# Patient Record
Sex: Female | Born: 1985 | Race: Black or African American | Hispanic: No | Marital: Single | Smoking: Never smoker
Health system: Southern US, Community
[De-identification: ages and names within clinical notes are randomized; demographics above are authoritative.]

## PROBLEM LIST (undated history)

## (undated) ENCOUNTER — Inpatient Hospital Stay (HOSPITAL_COMMUNITY): Payer: Self-pay

## (undated) DIAGNOSIS — O009 Unspecified ectopic pregnancy without intrauterine pregnancy: Secondary | ICD-10-CM

## (undated) DIAGNOSIS — A6000 Herpesviral infection of urogenital system, unspecified: Secondary | ICD-10-CM

## (undated) HISTORY — PX: NO PAST SURGERIES: SHX2092

---
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
## 2005-06-10 ENCOUNTER — Ambulatory Visit: Payer: Self-pay | Admitting: Obstetrics & Gynecology

## 2005-06-17 ENCOUNTER — Encounter (INDEPENDENT_AMBULATORY_CARE_PROVIDER_SITE_OTHER): Payer: Self-pay | Admitting: Specialist

## 2005-06-17 ENCOUNTER — Ambulatory Visit: Payer: Self-pay | Admitting: Obstetrics & Gynecology

## 2005-06-17 ENCOUNTER — Other Ambulatory Visit: Admission: RE | Admit: 2005-06-17 | Discharge: 2005-06-17 | Payer: Self-pay | Admitting: Obstetrics & Gynecology

## 2005-07-02 ENCOUNTER — Ambulatory Visit: Payer: Self-pay | Admitting: Obstetrics & Gynecology

## 2005-07-18 ENCOUNTER — Emergency Department (HOSPITAL_COMMUNITY): Admission: EM | Admit: 2005-07-18 | Discharge: 2005-07-18 | Payer: Self-pay | Admitting: Emergency Medicine

## 2005-07-29 ENCOUNTER — Emergency Department (HOSPITAL_COMMUNITY): Admission: EM | Admit: 2005-07-29 | Discharge: 2005-07-29 | Payer: Self-pay | Admitting: Emergency Medicine

## 2006-02-01 ENCOUNTER — Emergency Department (HOSPITAL_COMMUNITY): Admission: EM | Admit: 2006-02-01 | Discharge: 2006-02-01 | Payer: Self-pay

## 2006-04-24 ENCOUNTER — Emergency Department (HOSPITAL_COMMUNITY): Admission: EM | Admit: 2006-04-24 | Discharge: 2006-04-24 | Payer: Self-pay | Admitting: Emergency Medicine

## 2006-04-25 ENCOUNTER — Emergency Department (HOSPITAL_COMMUNITY): Admission: EM | Admit: 2006-04-25 | Discharge: 2006-04-25 | Payer: Self-pay | Admitting: Emergency Medicine

## 2006-06-13 ENCOUNTER — Emergency Department (HOSPITAL_COMMUNITY): Admission: EM | Admit: 2006-06-13 | Discharge: 2006-06-13 | Payer: Self-pay | Admitting: Emergency Medicine

## 2006-10-27 ENCOUNTER — Ambulatory Visit: Payer: Self-pay | Admitting: Obstetrics & Gynecology

## 2006-10-27 ENCOUNTER — Encounter: Payer: Self-pay | Admitting: Obstetrics & Gynecology

## 2006-10-27 ENCOUNTER — Other Ambulatory Visit: Admission: RE | Admit: 2006-10-27 | Discharge: 2006-10-27 | Payer: Self-pay | Admitting: Obstetrics & Gynecology

## 2007-03-25 ENCOUNTER — Emergency Department (HOSPITAL_COMMUNITY): Admission: EM | Admit: 2007-03-25 | Discharge: 2007-03-25 | Payer: Self-pay | Admitting: Emergency Medicine

## 2007-10-22 ENCOUNTER — Emergency Department (HOSPITAL_COMMUNITY): Admission: EM | Admit: 2007-10-22 | Discharge: 2007-10-22 | Payer: Self-pay | Admitting: Emergency Medicine

## 2007-11-17 ENCOUNTER — Emergency Department (HOSPITAL_COMMUNITY): Admission: EM | Admit: 2007-11-17 | Discharge: 2007-11-17 | Payer: Self-pay | Admitting: Emergency Medicine

## 2007-11-19 ENCOUNTER — Emergency Department (HOSPITAL_COMMUNITY): Admission: EM | Admit: 2007-11-19 | Discharge: 2007-11-19 | Payer: Self-pay | Admitting: Emergency Medicine

## 2008-02-02 ENCOUNTER — Emergency Department (HOSPITAL_COMMUNITY): Admission: EM | Admit: 2008-02-02 | Discharge: 2008-02-02 | Payer: Self-pay | Admitting: Emergency Medicine

## 2008-07-13 ENCOUNTER — Emergency Department (HOSPITAL_COMMUNITY): Admission: EM | Admit: 2008-07-13 | Discharge: 2008-07-14 | Payer: Self-pay | Admitting: Emergency Medicine

## 2009-01-31 ENCOUNTER — Emergency Department (HOSPITAL_COMMUNITY): Admission: EM | Admit: 2009-01-31 | Discharge: 2009-01-31 | Payer: Self-pay | Admitting: Emergency Medicine

## 2009-07-23 ENCOUNTER — Emergency Department (HOSPITAL_COMMUNITY): Admission: EM | Admit: 2009-07-23 | Discharge: 2009-07-23 | Payer: Self-pay | Admitting: Emergency Medicine

## 2010-02-11 ENCOUNTER — Emergency Department (HOSPITAL_COMMUNITY)
Admission: EM | Admit: 2010-02-11 | Discharge: 2010-02-11 | Disposition: A | Discharge status: Home / Self Care | Payer: Self-pay | Attending: Emergency Medicine | Admitting: Emergency Medicine

## 2010-02-11 DIAGNOSIS — B9689 Other specified bacterial agents as the cause of diseases classified elsewhere: Secondary | ICD-10-CM | POA: Insufficient documentation

## 2010-02-11 DIAGNOSIS — A499 Bacterial infection, unspecified: Secondary | ICD-10-CM | POA: Insufficient documentation

## 2010-02-11 DIAGNOSIS — N760 Acute vaginitis: Secondary | ICD-10-CM | POA: Insufficient documentation

## 2010-02-11 DIAGNOSIS — L989 Disorder of the skin and subcutaneous tissue, unspecified: Secondary | ICD-10-CM | POA: Insufficient documentation

## 2010-02-11 LAB — URINALYSIS, ROUTINE W REFLEX MICROSCOPIC
Bilirubin Urine: NEGATIVE
Hgb urine dipstick: NEGATIVE
Ketones, ur: NEGATIVE mg/dL
Nitrite: NEGATIVE
Protein, ur: NEGATIVE mg/dL
Specific Gravity, Urine: 1.025 (ref 1.005–1.030)
Urine Glucose, Fasting: NEGATIVE mg/dL
Urobilinogen, UA: 1 mg/dL (ref 0.0–1.0)
pH: 6.5 (ref 5.0–8.0)

## 2010-02-11 LAB — WET PREP, GENITAL
Trich, Wet Prep: NONE SEEN
Yeast Wet Prep HPF POC: NONE SEEN

## 2010-02-12 ENCOUNTER — Emergency Department (HOSPITAL_COMMUNITY)
Admission: EM | Admit: 2010-02-12 | Discharge: 2010-02-12 | Disposition: A | Discharge status: Home / Self Care | Payer: Self-pay | Attending: Emergency Medicine | Admitting: Emergency Medicine

## 2010-02-12 DIAGNOSIS — L989 Disorder of the skin and subcutaneous tissue, unspecified: Secondary | ICD-10-CM | POA: Insufficient documentation

## 2010-02-12 DIAGNOSIS — N898 Other specified noninflammatory disorders of vagina: Secondary | ICD-10-CM | POA: Insufficient documentation

## 2010-02-12 DIAGNOSIS — R21 Rash and other nonspecific skin eruption: Secondary | ICD-10-CM | POA: Insufficient documentation

## 2010-02-14 LAB — WOUND CULTURE: Gram Stain: NONE SEEN

## 2010-02-15 LAB — HERPES SIMPLEX VIRUS CULTURE: Culture: DETECTED

## 2010-03-18 ENCOUNTER — Inpatient Hospital Stay (HOSPITAL_COMMUNITY)
Admission: AD | Admit: 2010-03-18 | Discharge: 2010-03-18 | Disposition: A | Discharge status: Home / Self Care | Payer: Medicaid Other | Source: Ambulatory Visit | Attending: Obstetrics & Gynecology | Admitting: Obstetrics & Gynecology

## 2010-03-18 ENCOUNTER — Inpatient Hospital Stay (HOSPITAL_COMMUNITY): Payer: Medicaid Other

## 2010-03-18 DIAGNOSIS — R109 Unspecified abdominal pain: Secondary | ICD-10-CM

## 2010-03-18 DIAGNOSIS — R52 Pain, unspecified: Secondary | ICD-10-CM

## 2010-03-18 DIAGNOSIS — O99891 Other specified diseases and conditions complicating pregnancy: Secondary | ICD-10-CM | POA: Insufficient documentation

## 2010-03-18 DIAGNOSIS — O9989 Other specified diseases and conditions complicating pregnancy, childbirth and the puerperium: Secondary | ICD-10-CM

## 2010-03-18 LAB — HCG, QUANTITATIVE, PREGNANCY: hCG, Beta Chain, Quant, S: 4233 m[IU]/mL — ABNORMAL HIGH (ref <5)

## 2010-03-18 LAB — CBC
Hemoglobin: 13 g/dL (ref 12.0–15.0)
MCH: 29.5 pg (ref 26.0–34.0)
Platelets: 150 10*3/uL (ref 150–400)
RBC: 4.41 MIL/uL (ref 3.87–5.11)
WBC: 10.4 10*3/uL (ref 4.0–10.5)

## 2010-03-18 LAB — ABO/RH: ABO/RH(D): O POS

## 2010-03-20 ENCOUNTER — Inpatient Hospital Stay (HOSPITAL_COMMUNITY)
Admission: AD | Admit: 2010-03-20 | Discharge: 2010-03-20 | Disposition: A | Discharge status: Home / Self Care | Payer: Medicaid Other | Source: Ambulatory Visit | Attending: Family Medicine | Admitting: Family Medicine

## 2010-03-20 DIAGNOSIS — O00109 Unspecified tubal pregnancy without intrauterine pregnancy: Secondary | ICD-10-CM | POA: Insufficient documentation

## 2010-03-20 LAB — DIFFERENTIAL
Basophils Absolute: 0 10*3/uL (ref 0.0–0.1)
Basophils Relative: 0 % (ref 0–1)
Eosinophils Absolute: 0.1 10*3/uL (ref 0.0–0.7)
Monocytes Relative: 8 % (ref 3–12)
Neutrophils Relative %: 60 % (ref 43–77)

## 2010-03-20 LAB — BUN: BUN: 5 mg/dL — ABNORMAL LOW (ref 6–23)

## 2010-03-20 LAB — CBC
MCH: 29.6 pg (ref 26.0–34.0)
MCHC: 33.4 g/dL (ref 30.0–36.0)
Platelets: 163 10*3/uL (ref 150–400)
RBC: 4.32 MIL/uL (ref 3.87–5.11)

## 2010-03-20 LAB — AST: AST: 17 U/L (ref 0–37)

## 2010-03-22 LAB — URINALYSIS, ROUTINE W REFLEX MICROSCOPIC
Bilirubin Urine: NEGATIVE
Hgb urine dipstick: NEGATIVE
Ketones, ur: NEGATIVE mg/dL
Specific Gravity, Urine: 1.016 (ref 1.005–1.030)
pH: 5.5 (ref 5.0–8.0)

## 2010-03-22 LAB — WET PREP, GENITAL
Trich, Wet Prep: NONE SEEN
WBC, Wet Prep HPF POC: NONE SEEN
Yeast Wet Prep HPF POC: NONE SEEN

## 2010-03-22 LAB — GC/CHLAMYDIA PROBE AMP, GENITAL: GC Probe Amp, Genital: NEGATIVE

## 2010-03-23 ENCOUNTER — Inpatient Hospital Stay (HOSPITAL_COMMUNITY)
Admission: AD | Admit: 2010-03-23 | Discharge: 2010-03-23 | Disposition: A | Discharge status: Home / Self Care | Payer: Medicaid Other | Source: Ambulatory Visit | Attending: Obstetrics & Gynecology | Admitting: Obstetrics & Gynecology

## 2010-03-23 DIAGNOSIS — O200 Threatened abortion: Secondary | ICD-10-CM | POA: Insufficient documentation

## 2010-03-26 ENCOUNTER — Inpatient Hospital Stay (HOSPITAL_COMMUNITY)
Admission: AD | Admit: 2010-03-26 | Discharge: 2010-03-26 | Disposition: A | Discharge status: Home / Self Care | Payer: Medicaid Other | Source: Ambulatory Visit | Attending: Obstetrics and Gynecology | Admitting: Obstetrics and Gynecology

## 2010-03-26 DIAGNOSIS — O00109 Unspecified tubal pregnancy without intrauterine pregnancy: Secondary | ICD-10-CM | POA: Insufficient documentation

## 2010-04-03 ENCOUNTER — Inpatient Hospital Stay (HOSPITAL_COMMUNITY)
Admission: AD | Admit: 2010-04-03 | Discharge: 2010-04-03 | Disposition: A | Discharge status: Home / Self Care | Payer: Medicaid Other | Source: Ambulatory Visit | Attending: Obstetrics & Gynecology | Admitting: Obstetrics & Gynecology

## 2010-04-03 DIAGNOSIS — O00109 Unspecified tubal pregnancy without intrauterine pregnancy: Secondary | ICD-10-CM | POA: Insufficient documentation

## 2010-04-13 LAB — URINALYSIS, ROUTINE W REFLEX MICROSCOPIC
Bilirubin Urine: NEGATIVE
Glucose, UA: NEGATIVE mg/dL
Hgb urine dipstick: NEGATIVE
Nitrite: NEGATIVE
Specific Gravity, Urine: 1.012 (ref 1.005–1.030)
pH: 7 (ref 5.0–8.0)

## 2010-04-13 LAB — WET PREP, GENITAL
Trich, Wet Prep: NONE SEEN
WBC, Wet Prep HPF POC: NONE SEEN

## 2010-04-13 LAB — URINE MICROSCOPIC-ADD ON

## 2010-04-13 LAB — URINE CULTURE: Colony Count: >=100000

## 2010-04-13 LAB — CBC
HCT: 40.6 % (ref 36.0–46.0)
MCHC: 34.3 g/dL (ref 30.0–36.0)
MCV: 90.1 fL (ref 78.0–100.0)
Platelets: 182 10*3/uL (ref 150–400)
RDW: 12.4 % (ref 11.5–15.5)

## 2010-04-13 LAB — GC/CHLAMYDIA PROBE AMP, GENITAL
Chlamydia, DNA Probe: NEGATIVE
GC Probe Amp, Genital: NEGATIVE

## 2010-04-13 LAB — DIFFERENTIAL
Lymphs Abs: 0.8 10*3/uL (ref 0.7–4.0)
Monocytes Relative: 4 % (ref 3–12)

## 2010-04-13 LAB — BASIC METABOLIC PANEL
BUN: 8 mg/dL (ref 6–23)
CO2: 25 mEq/L (ref 19–32)
GFR calc non Af Amer: >60 mL/min (ref >60)
Glucose, Bld: 96 mg/dL (ref 70–99)
Potassium: 3.7 mEq/L (ref 3.5–5.1)

## 2010-04-14 ENCOUNTER — Inpatient Hospital Stay (HOSPITAL_COMMUNITY)
Admission: AD | Admit: 2010-04-14 | Discharge: 2010-04-14 | Disposition: A | Discharge status: Home / Self Care | Payer: Medicaid Other | Source: Ambulatory Visit | Attending: Family Medicine | Admitting: Family Medicine

## 2010-04-14 DIAGNOSIS — O00109 Unspecified tubal pregnancy without intrauterine pregnancy: Secondary | ICD-10-CM | POA: Insufficient documentation

## 2010-05-23 NOTE — Group Therapy Note (Signed)
NAME:  Amber Mathews, Amber Mathews NO.:  [id number]   MEDICAL RECORD NO.:  [id number]          PATIENT TYPE:  WOC   LOCATION:  [facility]                   FACILITY:  [facility]   PHYSICIAN:  Elsie Lincoln, MD      DATE OF BIRTH:  1985/08/28   DATE OF SERVICE:                                    CLINIC NOTE   PROCEDURE:  Patient presents for LEEP.  Patient was preoped a week ago, and  watched the video.  Consent was signed.  The patient was placed in the  dorsal lithotomy position and __________  speculum was placed into the  vagina.  The cervix was visualized and colposcopy was performed; 10 mL of 1%  lidocaine with 1:200,000 of epinephrine was injected on the cervix for  anesthesia.  A medium Fischer loop was used to complete the LEEP.  There was  good hemostasis.  The edges of the incision were rollerballed, and Monsel's  was placed into the os.  Patient tolerated the procedure well.  All counts  were correct x 2, and patient is to come back in 2 weeks for results.           ______________________________  Elsie Lincoln, MD     KL/MEDQ  D:  06/17/2005  T:  06/17/2005  Job:  [id number]

## 2010-05-23 NOTE — Group Therapy Note (Signed)
NAME:  Amber Mathews, Amber Mathews NO.:  [id number]   MEDICAL RECORD NO.:  [id number]          PATIENT TYPE:  WOC   LOCATION:  [facility]                   FACILITY:  [facility]   PHYSICIAN:  Dorthula Perfect, MD     DATE OF BIRTH:  1985-12-20   DATE OF SERVICE:  07/02/2005                                    CLINIC NOTE   This young lady returns for results from a LEEP that was performed by Dr.  Penne Lash on June 13.  Her colposcopic biopsies had shown moderate   Dictation ended at this point.           ______________________________  Dorthula Perfect, MD     ER/MEDQ  D:  07/02/2005  T:  07/02/2005  Job:  [id number]

## 2010-05-23 NOTE — Group Therapy Note (Signed)
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  The patient came today for preop consultation and  also to review medical history to make sure there are no problems to do the  LEEP in the office.   PAST MEDICAL HISTORY:  Denies.   PAST SURGICAL HISTORY:  Denies.   PAST GYNECOLOGIC HISTORY:  Denies.   PAST OBSTETRICAL HISTORY:  NSVD x1.   PAST SOCIAL HISTORY:  Drinks caffeinated beverages.  Denies alcohol, tobacco  or drugs.   REVIEW OF SYSTEMS:  Systemic review is negative on all 13 points.   FAMILY HISTORY:  Denies blood clots or bleeding.   ASSESSMENT:  [age]-year-old female with cervical intraepithelial  neoplasia, grade 3, on biopsy, sexually active since age 35.   PLAN:  1.  LEEP to be scheduled.  2.  LEEP video watched today.           ______________________________  Elsie Lincoln, MD     KL/MEDQ  D:  06/10/2005  T:  06/11/2005  Job:  [id number]

## 2010-08-16 ENCOUNTER — Inpatient Hospital Stay (HOSPITAL_COMMUNITY)
Admission: AD | Admit: 2010-08-16 | Discharge: 2010-08-16 | Disposition: A | Discharge status: Home / Self Care | Payer: Medicaid Other | Source: Ambulatory Visit | Attending: Obstetrics & Gynecology | Admitting: Obstetrics & Gynecology

## 2010-08-16 DIAGNOSIS — A6000 Herpesviral infection of urogenital system, unspecified: Secondary | ICD-10-CM | POA: Insufficient documentation

## 2010-08-16 MED ORDER — ACYCLOVIR 400 MG PO TABS: 400.0000 mg | ORAL_TABLET | Freq: Three times a day (TID) | ORAL | Status: AC

## 2010-08-16 NOTE — Progress Notes (Signed)
Patient presents with c/o herpes outbreak since Thursday was seen at [facility] but not given a prescription

## 2010-08-16 NOTE — ED Provider Notes (Signed)
History     CSN: [id number] Arrival date & time: 08/16/2010 10:14 AM  Chief Complaint  Patient presents with  . Vaginitis    herpes outbreak on Thursday    HPI Amber Mathews is a 25 y.o. female who presents to [facility] with HSV outbreak that started 3 days ago. [facility] and told them at that time she felt like an outbreak coming on but the doctor forgot to give her Rx for the valtrex. Lesions today and increased pain.    No past medical history on file.  No past surgical history on file.  No family history on file.  History  Substance Use Topics  . Smoking status: Not on file  . Smokeless tobacco: Not on file  . Alcohol Use: Not on file    OB History    No data available      Review of Systems  Genitourinary: Positive for vaginal pain.  All other systems reviewed and are negative.    Physical Exam  BP 123/77  Pulse 75  Temp(Src) 98.1 F (36.7 C) (Oral)  Resp 16  Ht 5' 5.5" (1.664 m)  Wt 152 lb 6.4 oz (69.128 kg)  BMI 24.97 kg/m2  LMP 07/27/2010  Physical Exam  Nursing note and vitals reviewed. Constitutional: She is oriented to person, place, and time. She appears well-developed and well-nourished.  Neck: Neck supple.  Pulmonary/Chest: Effort normal.  Genitourinary:       vesicular lesions noted left labia consistent with HSV.  Musculoskeletal: Normal range of motion.  Neurological: She is alert and oriented to person, place, and time. No cranial nerve deficit.    ED Course  Procedures  MDM  Assessment: Recurrent Genital herpes  Plan: Acyclovir 400 mg. Po tid x 5 days          Ibuprofen prn.      [place], [place] 08/16/10 1126

## 2010-08-20 NOTE — ED Provider Notes (Signed)
Agree with above note.  Raquelle Pietro A 08/20/2010 9:19 AM   

## 2010-10-07 LAB — URINALYSIS, ROUTINE W REFLEX MICROSCOPIC
Bilirubin Urine: NEGATIVE
Bilirubin Urine: NEGATIVE
Glucose, UA: NEGATIVE
Glucose, UA: NEGATIVE
Hgb urine dipstick: NEGATIVE
Ketones, ur: NEGATIVE
Ketones, ur: NEGATIVE
Nitrite: NEGATIVE
Protein, ur: NEGATIVE
Specific Gravity, Urine: 1.014
Urobilinogen, UA: 1
pH: 6
pH: 7.5

## 2010-10-07 LAB — COMPREHENSIVE METABOLIC PANEL
ALT: 10
ALT: 11
AST: 21
AST: 24
Albumin: 3.6
Albumin: 4.2
Alkaline Phosphatase: 127 — ABNORMAL HIGH
Alkaline Phosphatase: 95
BUN: 1 — ABNORMAL LOW
BUN: 3 — ABNORMAL LOW
CO2: 22
CO2: 25
Calcium: 9.1
Calcium: 9.5
Chloride: 105
Chloride: 106
Creatinine, Ser: 0.96
Creatinine, Ser: 0.96
GFR calc Af Amer: >60
GFR calc Af Amer: >60
GFR calc non Af Amer: >60
GFR calc non Af Amer: >60
Glucose, Bld: 83
Glucose, Bld: 86
Potassium: 3.6
Potassium: 4.2
Sodium: 134 — ABNORMAL LOW
Sodium: 139
Total Bilirubin: 0.9
Total Bilirubin: 1.3 — ABNORMAL HIGH
Total Protein: 6.1
Total Protein: 7

## 2010-10-07 LAB — DIFFERENTIAL
Basophils Absolute: 0
Basophils Absolute: 0
Basophils Relative: 0
Basophils Relative: 1
Eosinophils Absolute: 0.1
Eosinophils Absolute: 0.1
Eosinophils Relative: 1
Eosinophils Relative: 1
Lymphocytes Relative: 21
Lymphocytes Relative: 29
Lymphs Abs: 1.6
Lymphs Abs: 2.2
Monocytes Absolute: 0.6
Monocytes Absolute: 0.9
Monocytes Relative: 11
Monocytes Relative: 8
Neutro Abs: 4.5
Neutro Abs: 5.4
Neutrophils Relative %: 58
Neutrophils Relative %: 70

## 2010-10-07 LAB — URINE MICROSCOPIC-ADD ON

## 2010-10-07 LAB — CBC
HCT: 40.3
HCT: 43.6
Hemoglobin: 13.5
Hemoglobin: 15.1 — ABNORMAL HIGH
MCHC: 33.6
MCHC: 34.6
MCV: 88.6
MCV: 90
Platelets: 219
Platelets: 252
RBC: 4.47
RBC: 4.92
RDW: 12
RDW: 12.2
WBC: 7.7
WBC: 7.8

## 2010-10-07 LAB — URINE CULTURE: Colony Count: >=100000

## 2010-10-07 LAB — POCT PREGNANCY, URINE: Preg Test, Ur: NEGATIVE

## 2010-10-07 LAB — GC/CHLAMYDIA PROBE AMP, GENITAL
Chlamydia, DNA Probe: NEGATIVE
GC Probe Amp, Genital: NEGATIVE

## 2010-10-07 LAB — WET PREP, GENITAL: Yeast Wet Prep HPF POC: NONE SEEN

## 2010-10-07 LAB — LIPASE, BLOOD: Lipase: 16

## 2010-10-15 LAB — POCT PREGNANCY, URINE
Operator id: 148111
Preg Test, Ur: NEGATIVE

## 2010-10-23 LAB — URINALYSIS, ROUTINE W REFLEX MICROSCOPIC
Glucose, UA: NEGATIVE
Hgb urine dipstick: NEGATIVE
Ketones, ur: NEGATIVE
Protein, ur: NEGATIVE
Urobilinogen, UA: 1

## 2010-10-23 LAB — POCT PREGNANCY, URINE: Preg Test, Ur: NEGATIVE

## 2011-01-20 ENCOUNTER — Emergency Department (HOSPITAL_COMMUNITY)
Admission: EM | Admit: 2011-01-20 | Discharge: 2011-01-21 | Disposition: A | Discharge status: Home / Self Care | Payer: Self-pay | Attending: Emergency Medicine | Admitting: Emergency Medicine

## 2011-01-20 ENCOUNTER — Encounter (HOSPITAL_COMMUNITY): Payer: Self-pay | Admitting: *Deleted

## 2011-01-20 DIAGNOSIS — L298 Other pruritus: Secondary | ICD-10-CM | POA: Insufficient documentation

## 2011-01-20 DIAGNOSIS — S30860A Insect bite (nonvenomous) of lower back and pelvis, initial encounter: Secondary | ICD-10-CM | POA: Insufficient documentation

## 2011-01-20 DIAGNOSIS — W57XXXA Bitten or stung by nonvenomous insect and other nonvenomous arthropods, initial encounter: Secondary | ICD-10-CM | POA: Insufficient documentation

## 2011-01-20 DIAGNOSIS — Y92009 Unspecified place in unspecified non-institutional (private) residence as the place of occurrence of the external cause: Secondary | ICD-10-CM | POA: Insufficient documentation

## 2011-01-20 DIAGNOSIS — R21 Rash and other nonspecific skin eruption: Secondary | ICD-10-CM | POA: Insufficient documentation

## 2011-01-20 DIAGNOSIS — S90569A Insect bite (nonvenomous), unspecified ankle, initial encounter: Secondary | ICD-10-CM | POA: Insufficient documentation

## 2011-01-20 DIAGNOSIS — L2989 Other pruritus: Secondary | ICD-10-CM | POA: Insufficient documentation

## 2011-01-20 NOTE — ED Provider Notes (Signed)
History     CSN: [id number]  Arrival date & time 01/20/11  2306   First MD Initiated Contact with Patient 01/20/11 2349      Chief Complaint  Patient presents with  . Rash    to back, RT arm and LT thigh x 3 days w/itching.  no fevers, n/v/d.    (Consider location/radiation/quality/duration/timing/severity/associated sxs/prior treatment) Patient is a 26 y.o. female presenting with rash. The history is provided by the patient.  Rash  This is a new problem. The current episode started 2 days ago. The problem has been gradually improving. The problem is associated with nothing (She reports single raised lesions to back and one to left thigh after going to a family home and sleeping on couch. She reports her sister has similar rash.). Associated symptoms include itching. Pertinent negatives include no blisters.    History reviewed. No pertinent past medical history.  History reviewed. No pertinent past surgical history.  No family history on file.  History  Substance Use Topics  . Smoking status: Never Smoker   . Smokeless tobacco: Not on file  . Alcohol Use: No    OB History    Grav Para Term Preterm Abortions TAB SAB Ect Mult Living                  Review of Systems  Constitutional: Negative for fever and chills.  Respiratory: Negative.   Cardiovascular: Negative.   Skin: Positive for itching and rash.  Neurological: Negative.     Allergies  Review of patient's allergies indicates no known allergies.  Home Medications  No current outpatient prescriptions on file.  BP 133/82  Pulse 104  Temp 99.1 F (37.3 C)  Resp 20  Ht 5\' 5"  (1.651 m)  Wt 140 lb (63.504 kg)  BMI 23.30 kg/m2  SpO2 100%  LMP 01/01/2011  Physical Exam  Constitutional: She is oriented to person, place, and time. She appears well-developed and well-nourished.  Neck: Normal range of motion.  Pulmonary/Chest: Effort normal.  Neurological: She is alert and oriented to person, place, and  time.  Skin: Skin is warm and dry.       Single whelts x 4 to back without ulcerations or blisters.     ED Course  Procedures (including critical care time)  Labs Reviewed - No data to display No results found.   No diagnosis found.    MDM          Rodena Medin, PA-C 01/20/11 2354  Medical screening examination/treatment/procedure(s) were performed by non-physician practitioner and as supervising physician I was immediately available for consultation/collaboration.  Sunnie Nielsen, MD 01/21/11 [phone]

## 2011-01-20 NOTE — ED Notes (Signed)
Pt states she was at her grandmother house over the weekend. Pt state she noticed a bump on her thigh. Pt states then she started to notice bumps on her back. Bumps are redden and raised. Pt denies any change in soap.

## 2011-02-23 ENCOUNTER — Inpatient Hospital Stay (HOSPITAL_COMMUNITY): Payer: Medicaid Other

## 2011-02-23 ENCOUNTER — Encounter (HOSPITAL_COMMUNITY): Payer: Self-pay | Admitting: *Deleted

## 2011-02-23 ENCOUNTER — Inpatient Hospital Stay (HOSPITAL_COMMUNITY)
Admission: AD | Admit: 2011-02-23 | Discharge: 2011-02-23 | Disposition: A | Discharge status: Home / Self Care | Payer: Medicaid Other | Source: Ambulatory Visit | Attending: Family Medicine | Admitting: Family Medicine

## 2011-02-23 DIAGNOSIS — R109 Unspecified abdominal pain: Secondary | ICD-10-CM | POA: Insufficient documentation

## 2011-02-23 DIAGNOSIS — R1084 Generalized abdominal pain: Secondary | ICD-10-CM

## 2011-02-23 LAB — CBC
HCT: 39.8 % (ref 36.0–46.0)
MCHC: 34.2 g/dL (ref 30.0–36.0)
Platelets: 268 10*3/uL (ref 150–400)
RDW: 13.4 % (ref 11.5–15.5)
WBC: 8.9 10*3/uL (ref 4.0–10.5)

## 2011-02-23 LAB — URINALYSIS, ROUTINE W REFLEX MICROSCOPIC
Glucose, UA: NEGATIVE mg/dL
Leukocytes, UA: NEGATIVE
Specific Gravity, Urine: >1.03 — ABNORMAL HIGH (ref 1.005–1.030)
pH: 5.5 (ref 5.0–8.0)

## 2011-02-23 LAB — URINE MICROSCOPIC-ADD ON

## 2011-02-23 MED ORDER — IBUPROFEN 600 MG PO TABS: 600.0000 mg | ORAL_TABLET | Freq: Four times a day (QID) | ORAL | Status: AC | PRN

## 2011-02-23 MED ORDER — OXYCODONE-ACETAMINOPHEN 5-325 MG PO TABS: 1.0000 | ORAL_TABLET | ORAL | Status: AC | PRN

## 2011-02-23 NOTE — Discharge Instructions (Signed)
Patient information: Abortion (pregnancy termination) (Beyond the Basics)  Author Shellia Carwin, MS, MPH, WHNP-BC Section Editor Enos Fling, MD Deputy Editor Morton Amy, MD Disclosures  All topics are updated as new evidence becomes available and our peer review process is complete.  Literature review current through: Jan 2013.  This topic last updated: Jan 27, 2011.  ABORTION OVERVIEW -- Abortion, also known as pregnancy termination, is a procedure that is performed to end a pregnancy. In the [place], abortion is a safe and legal option for women who cannot or choose not to continue with a pregnancy. Deciding to have an abortion is a very personal decision. Any woman considering abortion should understand the risks and benefits of the various types of abortion, as well as the alternatives to abortion, including parenting and adoption. This article will help to explain these issues and will briefly discuss legal abortion procedures, including the recommended follow up. If you have questions or concerns about abortion after reading this article, find a supportive healthcare provider or clinic that provides abortion services. (See 'Where to get more information' below.) IS ABORTION RIGHT FOR ME? -- In the [place], almost 50 percent of pregnancies are unintended, and almost half of these end through abortion [1]. Women have many reasons for considering abortion. Some of the most common reasons include the following: Having a baby would interfere with family, work, school, or other responsibilities  Unable to afford raising a child  Do not want to be a single parent  Having problems with husband or partner  Find out that something is wrong with the fetus  Have health problems that make pregnancy a problem If you are not sure if abortion is the right decision for you, talk to a supportive healthcare provider or a clinic that provides abortion services. You may also want to talk  to a friend, family member, or your partner. It is important to share your thoughts and feelings about this decision with people who you can trust and who will support you, no matter what you decide. (See 'Where to get more information' below.) Most states allow abortion until the pregnancy has reached "viability" or 24 weeks. If you are under age 70 or 19 and live in the [place], you may need one or both of your parents' permission to have an abortion. In most states, if it is not possible to get your parents' permission, you can speak with a judge to get permission for an abortion without your parents' or guardian's approval. Healthcare providers who provide abortions can help you with this process, if needed. The laws for each state are available on the websites of some organizations. (See 'Where to get more information' below.) TYPES OF ABORTION PROCEDURES -- There are two basic ways abortions are performed: One is called "medical" or "medication abortion," meaning that you take medicine to end the pregnancy.  The other is called "surgical" or "aspiration" abortion, meaning that a health care provider does a procedure to remove the pregnancy. Medication or surgical abortion? -- The type of abortion procedure you have depends on a number of factors, including how far along you are in your pregnancy, what type of abortions are available in your area, and your personal preferences. "Early" medication abortion, in which a woman takes medicine by mouth, is most effective if she is less than 8 to [redacted] weeks pregnant. If your pregnancy is later (beyond 14 to 16 weeks), you may be given medicines that induce labor to  cause an abortion. This type of abortion would be performed in a hospital and is usually called "labor induction." It is not as common as surgical or "early medication" abortion. Reasons that you might prefer an early medication abortion: You would prefer to be in the privacy of your home when you  pass the pregnancy tissue  You would prefer to avoid having anesthesia  You would prefer to avoid medical instruments being inserted into your uterus to remove the pregnancy. In 2 percent of cases, medication abortion does not end or completely remove the pregnancy tissue from inside the uterus and a surgical procedure is needed. (See 'How effective is early medication abortion?' below.) Reasons that you might prefer surgical abortion: You prefer to have the abortion completed in one visit  You are not comfortable with the idea of heavy vaginal bleeding and passing pregnancy tissue at home  You would prefer to have anesthesia to minimize pain Initial evaluation -- With both medication and surgical abortion, you will need the following before the procedure. A pregnancy test or ultrasound to confirm that you are pregnant and determine how far along your pregnancy is. To determine the current length of your pregnancy, you can use this calculator (calculator 1).  A blood test to determine your blood count (to make sure you do not have anemia) and blood type. If you have a negative blood type (eg, A negative), you will be given an injection of a medication called Rh immune globulin (Rhogam) after the abortion. This helps to prevent complications in future pregnancies.  You may be offered testing for sexually transmitted infections. Commonly performed tests include gonorrhea, chlamydia, and HIV. Testing for syphilis and hepatitis A, B, and C may also be recommended.  You will discuss the risks and benefits of abortion, the available procedures (medication and surgical), and alternatives to abortion (parenting, adoption) with a counselor, doctor, or nurse. This is an important step; if you have any questions or concerns, this is the time to ask.  In some states, women must wait for a certain amount of time (usually 24 hours) between the counseling, described above, and the abortion. In other states, the  abortion can be performed on the same day as the counseling. For information about your state, see www.FindDrives.pl.pdf  You will discuss options for birth control. After medication or surgical abortion, you can become pregnant again quickly, even before your next menstrual period. You can start using most methods (pill, patch, vaginal ring, injection, intrauterine device [IUD], implants) on the day of an aspiration abortion. With medication abortion, you can start birth control after your pregnancy has ended, usually within a few days after your first visit. (See "Patient information: Birth control; which method is right for me? (Beyond the Basics)".) EARLY MEDICATION ABORTION -- Early medication abortion usually involves taking two different medications to end an early pregnancy. In the [place], early medication abortion may be an option if you are 7 to 9 weeks or less (49 to 63 days) pregnant (clinics vary in their policies on this). Medication can also be used for women who are 14 or more weeks, but for this type of abortion, the woman is in the hospital under the supervision of a doctor or nurse. To determine if you can have an early medication abortion, use this calculator (calculator 1). Early medication abortion is available in some medical offices and hospitals and in most clinics that provide abortion services. The following steps are involved in most cases: A  healthcare provider will confirm how many days pregnant you are, either by using a calculator or by using ultrasound measurements of the pregnancy.  You will be given two medications, usually mifepristone and misoprostol. Information about these medications is available online (see www.earlyoptionpill.com or www.StickerEmporium.tn.html).  You will take the mifepristone at the clinic or medical office. You will take the misoprostol several hours or days later, usually at  home. Expected side effects -- Abdominal pain, cramps, and vaginal bleeding are expected side effects with early medication abortion. Some women also have fever, nausea, vomiting, or diarrhea. Pain and cramps -- Most women will have abdominal pain and cramps after taking the second medication (misoprostol). These cramps may be mild or strong. The pain usually improves after the pregnancy has passed out of your uterus, within 4 to 6 hours after taking the misoprostol. You can take 600 to 800 mg of ibuprofen (Advil, Motrin) every 6 to 8 hours for pain, if needed. Some doctors and nurses also give a prescription for a stronger pain medication to use if needed. You can also use a heating pad on your abdomen. If you have severe pain that is not relieved by these treatments, call your clinic immediately. Vaginal bleeding -- It is normal to experience vaginal bleeding with an early medication abortion. The bleeding may be heavy, especially in the first few hours after you take the misoprostol. The bleeding usually decreases after you pass the pregnancy tissue out of your uterus, and then continues for several weeks. It should be lighter than a menstrual period after the first few days. If you are bleeding so heavily that you soak through one menstrual pad per hour for two hours in a row and you are still bleeding, you should call your healthcare provider or clinic immediately. If you do not have bleeding at all after you take the medications, you should also call your doctor or nurse. Fever, nausea, vomiting, diarrhea -- Some women experience a mild fever, nausea, vomiting, or diarrhea after taking the second medication (misoprostol). This usually goes away quickly on its own without treatment. If you develop a fever higher than 100.17F (38C), or if you have chills, vomiting, or diarrhea that does not go away within several hours, call your doctor or nurse. How effective is early medication abortion? -- Early  medication abortion is very effective in ending pregnancies that are up to 49 days (less than 7 weeks). It is nearly as effective in ending pregnancies up to 56 or 63 days (8 or 9 weeks). If early medication abortion is not effective in ending your pregnancy, you will need to have an aspiration abortion to remove the pregnancy. Continuing a pregnancy after taking mifepristone or misoprostol is not safe due to the risk of birth defects from the medications. For signs that your abortion was not effective, (see 'When to seek help after abortion' below). SURGICAL ABORTION -- Surgical abortion is a procedure that is done in a clinic or hospital to end a pregnancy. The procedure is done by removing the pregnancy tissue from the uterus through the opening, called the cervix. (See "Surgical termination of pregnancy: First trimester".) In most cases, you can choose to have a surgical abortion while you are awake, using only local anesthesia, or while you are sedated. Some providers also offer other medicines to reduce pain and anxiety, including medicines you take by mouth. If you are more than [redacted] weeks pregnant, you will probably require sedation. To determine how far along your pregnancy  is, use this calculator (calculator 1). (See "Termination of pregnancy: Second trimester".) A health care provider gives local anesthesia by injecting your cervix with anesthetic medication. This usually causes some mild pain that passes quickly. You will not require an intravenous (IV) line if you have only local anesthesia. If you have sedation, you will have an IV line placed in a vein, and medication will be given to make you feel sleepy. Many people do not remember much about the procedure after the sedative medication is given. You will also be given local anesthesia, after the sedative. General anesthesia (where you are completely unconscious) is not recommended for abortion in most cases. The procedure usually takes between 5  and 20 minutes, and is usually shortest in early pregnancy. You will be monitored in a recovery area for about an hour after the procedure (longer if you are given a sedative). Expected side effects -- Vaginal bleeding, abdominal pain, and cramping are expected side effects after a surgical abortion. Abdominal pain and cramping -- Most women have some abdominal pain and cramping after a surgical abortion. You can take 600 to 800 mg ibuprofen (Advil, Motrin) every 6 to 8 hours for pain, if needed. Some doctors give a prescription for a stronger pain medication that you can take if needed. The pain usually lasts several hours. If you have severe pain that does not get better with these treatments or if your pain continues for more than a few days after the procedure, call your doctor or nurse. Vaginal bleeding -- It is normal to have some vaginal bleeding after a surgical abortion. Usually the bleeding is less than what you have with a normal menstrual period. The bleeding usually lasts a few days to two weeks, and should become lighter after the first few days. You may pass also pieces of tissue or blood clots. If you are bleeding so heavily that you soak through one menstrual pad per hour for two hours in a row and you are still bleeding, you should call your healthcare provider or clinic immediately. WHEN TO SEEK HELP AFTER ABORTION -- Call your doctor or clinic immediately if: You are bleeding so heavily that you soak through one menstrual pad per hour for two hours in a row and you are still bleeding.  You have severe pain that is not relieved by pain medications.  You have shaking chills or develop a temperature higher than 100.31F or 38C (use a thermometer to measure your temperature).  You have foul-smelling or pus-like vaginal discharge. In addition, you should be aware of signs that your abortion was not complete. Call your healthcare provider if: You do not have vaginal bleeding after a  medication abortion.  Your pregnancy symptoms (breast tenderness, nausea) do not resolve within one week after your abortion. You should not do a home pregnancy test, even if you still feel pregnant, because it is likely to be positive for up to 6 weeks after having an abortion.  You continue to bleed for more than 2 weeks after your abortion.  You do not have a menstrual period within 6 weeks after your abortion. FOLLOW UP CARE -- You should not have sex or put anything in your vagina (tampons, douches) for two weeks after an abortion. Putting anything in the vagina before this time could lead to an infection. About one week after a medication abortion, you should have a follow up visit with your doctor or nurse. It is very important to go to this visit  to be sure you are no longer pregnant. Approximately two weeks after a surgical abortion, you should have a follow up visit with your primary care provider or a provider at the site where you had the abortion. At this visit, your will have a pelvic examination and can review how you are feeling. If you have not already started using birth control, you should discuss what method is best at this follow up visit. (See "Patient information: Birth control; which method is right for me? (Beyond the Basics)".) ABORTION COMPLICATIONS -- Legal abortions are safe and usually cause no serious complications. However, complications do sometimes occur, as with any medical or surgical procedure. Complications can include excessive bleeding, injury to the cervix or uterus, infection, and the potential need for more surgery to remove pregnancy tissue. These complications occur in a very small number of cases [2]. MYTHS AND FACTS ABOUT ABORTION Abortion is not safe - MYTH. Legal abortions are one of the safest medical procedures available today. While abortion is not risk-free, the risk of having an abortion is far less than the risk of carrying a pregnancy and giving  birth. Abortions done early in pregnancy (before 13 weeks of pregnancy) have fewer risks than abortions done later in pregnancy [2,3].  Abortions that are performed by someone without training are not safe and can lead to serious complications, including bleeding, infection, infertility, and even death - FACT.  Abortion will make me infertile - MYTH. Legal abortions do not make it more difficult to become pregnant in the future.  Abortion increases my risk of breast cancer - MYTH. Several studies have conclusively shown that having an abortion does NOT increase the risk of developing breast cancer [4].  Abortion increases my chance of miscarriage - PROBABLE MYTH. There have been a number of studies that have tried to determine if abortion increases the risk of miscarriage with future pregnancies. Most well-designed studies have not found that legal abortion in the first trimester increases the risk of miscarriage, preterm delivery, or other pregnancy complications [5-7]. WHERE TO GET MORE INFORMATION -- Your healthcare provider is the best source of information for questions and concerns related to your medical problem.

## 2011-02-23 NOTE — ED Provider Notes (Signed)
Amber C Hairston25 [id number] @Unknown  Chief Complaint  Patient presents with  . Abdominal Pain    SUBJECTIVE  HPI: Pt presents with worsening sharp lower abdominal pain following elective termination on 02/18/11.  She denies vaginal bleeding, fever/chills, n/v, fatigue, or dizziness.    Past Medical History  Diagnosis Date  . No pertinent past medical history    Past Surgical History  Procedure Date  . No past surgeries    History   Social History  . Marital Status: Single    Spouse Name: N/A    Number of Children: N/A  . Years of Education: N/A   Occupational History  . Not on file.   Social History Main Topics  . Smoking status: Never Smoker   . Smokeless tobacco: Not on file  . Alcohol Use: No  . Drug Use: No  . Sexually Active: Yes    Birth Control/ Protection: None   Other Topics Concern  . Not on file   Social History Narrative  . No narrative on file   No current facility-administered medications on file prior to encounter.   No current outpatient prescriptions on file prior to encounter.   No Known Allergies  ROS: Pertinent items in HPI  OBJECTIVE Blood pressure 136/79, pulse 78, temperature 98.6 F (37 C), resp. rate 18, height 5\' 5"  (1.651 m), weight 65.772 kg (145 lb), last menstrual period 02/18/2011, SpO2 100.00%. GENERAL: Well-developed, well-nourished female in no acute distress.  LUNGS: Clear to auscultation bilaterally.  HEART: Regular rate and rhythm. ABDOMEN: Soft, nontender EXTREMITIES: Nontender, no edema EXTERNAL GENITALIA: normal VAGINA: physiologic discharge CERVIX: long/closed/high Adnexa with no masses, tenderness, or enlargement    RESULTS Results for orders placed during the hospital encounter of 02/23/11 (from the past 24 hour(s))  URINALYSIS, ROUTINE W REFLEX MICROSCOPIC     Status: Abnormal   Collection Time   02/23/11  8:30 PM      Component Value Range   Color, Urine YELLOW  YELLOW    APPearance CLEAR   CLEAR    Specific Gravity, Urine >1.030 (*) 1.005 - 1.030    pH 5.5  5.0 - 8.0    Glucose, UA NEGATIVE  NEGATIVE (mg/dL)   Hgb urine dipstick SMALL (*) NEGATIVE    Bilirubin Urine NEGATIVE  NEGATIVE    Ketones, ur NEGATIVE  NEGATIVE (mg/dL)   Protein, ur NEGATIVE  NEGATIVE (mg/dL)   Urobilinogen, UA 0.2  0.0 - 1.0 (mg/dL)   Nitrite NEGATIVE  NEGATIVE    Leukocytes, UA NEGATIVE  NEGATIVE   URINE MICROSCOPIC-ADD ON     Status: Abnormal   Collection Time   02/23/11  8:30 PM      Component Value Range   Squamous Epithelial / LPF FEW (*) RARE    WBC, UA 0-2  <3 (WBC/hpf)   RBC / HPF 3-6  <3 (RBC/hpf)   Bacteria, UA FEW (*) RARE   CBC     Status: Normal   Collection Time   02/23/11 10:20 PM      Component Value Range   WBC 8.9  4.0 - 10.5 (K/uL)   RBC 4.61  3.87 - 5.11 (MIL/uL)   Hemoglobin 13.6  12.0 - 15.0 (g/dL)   HCT 11.9  14.7 - 82.9 (%)   MCV 86.3  78.0 - 100.0 (fL)   MCH 29.5  26.0 - 34.0 (pg)   MCHC 34.2  30.0 - 36.0 (g/dL)   RDW 56.2  13.0 - 86.5 (%)   Platelets 268  150 - 400 (K/uL)  HCG, QUANTITATIVE, PREGNANCY     Status: Abnormal   Collection Time   02/23/11 10:20 PM      Component Value Range   hCG, Beta Chain, Quant, S 2187 (*) <5 (mIU/mL)    IMAGING US Transvaginal Non-ob  02/23/2011  *RADIOLOGY REPORT*  Clinical Data: Right lower quadrant abdominal pain.  Status post therapeutic abortion on 02/18/2011.  TRANSABDOMINAL AND TRANSVAGINAL ULTRASOUND OF PELVIS Technique:  Both transabdominal and transvaginal ultrasound examinations of the pelvis were performed. Transabdominal technique was performed for global imaging of the pelvis including uterus, ovaries, adnexal regions, and pelvic cul-de-sac.  Comparison: 03/18/2010.   It was necessary to proceed with endovaginal exam following the transabdominal exam to visualize the endometrium better.  Findings:  Uterus: Normal appearing retroflexed uterus with an arcuate configuration.  Endometrium: Arcuate configuration, normal  in thickness and echotexture, measuring 10.3 mm in maximum thickness transvaginally.  Right ovary:  Normal appearance/no adnexal mass  Left ovary: Normal appearance/no adnexal mass  Other findings: Trace amount of free peritoneal fluid, within normal limits of physiological fluid.  IMPRESSION: Normal examination.  Original Report Authenticated By: Darrol Angel, M.D.   US Pelvis Complete  02/23/2011  *RADIOLOGY REPORT*  Clinical Data: Right lower quadrant abdominal pain.  Status post therapeutic abortion on 02/18/2011.  TRANSABDOMINAL AND TRANSVAGINAL ULTRASOUND OF PELVIS Technique:  Both transabdominal and transvaginal ultrasound examinations of the pelvis were performed. Transabdominal technique was performed for global imaging of the pelvis including uterus, ovaries, adnexal regions, and pelvic cul-de-sac.  Comparison: 03/18/2010.   It was necessary to proceed with endovaginal exam following the transabdominal exam to visualize the endometrium better.  Findings:  Uterus: Normal appearing retroflexed uterus with an arcuate configuration.  Endometrium: Arcuate configuration, normal in thickness and echotexture, measuring 10.3 mm in maximum thickness transvaginally.  Right ovary:  Normal appearance/no adnexal mass  Left ovary: Normal appearance/no adnexal mass  Other findings: Trace amount of free peritoneal fluid, within normal limits of physiological fluid.  IMPRESSION: Normal examination.  Original Report Authenticated By: Darrol Angel, M.D.    ASSESSMENT Pelvic pain following termination No evidence of retained POC  PLAN D/C home with bleeding precautions Ibuprofen 600 mg and Percocet 5/325 x10 F/U as scheduled with provider of abortion services Return to [facility] as needed

## 2011-02-23 NOTE — Progress Notes (Signed)
Pt reports she had an abortion on  02/13 and is having "really sharp pains" in her stomach and lower back. Denies fever. Brownish discharge with wiping.

## 2011-02-23 NOTE — Progress Notes (Signed)
Wallace Cullens, CNM at bedside.  poc discussed with pt.  VE done.

## 2011-03-02 NOTE — ED Provider Notes (Signed)
Chart reviewed and agree with management and plan.  

## 2011-04-07 ENCOUNTER — Encounter (HOSPITAL_COMMUNITY): Payer: Self-pay

## 2011-04-07 ENCOUNTER — Emergency Department (HOSPITAL_COMMUNITY)
Admission: EM | Admit: 2011-04-07 | Discharge: 2011-04-07 | Disposition: A | Discharge status: Home / Self Care | Payer: Self-pay | Attending: Emergency Medicine | Admitting: Emergency Medicine

## 2011-04-07 DIAGNOSIS — H00039 Abscess of eyelid unspecified eye, unspecified eyelid: Secondary | ICD-10-CM | POA: Insufficient documentation

## 2011-04-07 DIAGNOSIS — H5710 Ocular pain, unspecified eye: Secondary | ICD-10-CM | POA: Insufficient documentation

## 2011-04-07 DIAGNOSIS — H5789 Other specified disorders of eye and adnexa: Secondary | ICD-10-CM | POA: Insufficient documentation

## 2011-04-07 DIAGNOSIS — L03213 Periorbital cellulitis: Secondary | ICD-10-CM

## 2011-04-07 MED ORDER — CLINDAMYCIN HCL 150 MG PO CAPS: 450.0000 mg | ORAL_CAPSULE | Freq: Three times a day (TID) | ORAL | Status: AC

## 2011-04-07 MED ORDER — CLINDAMYCIN HCL 300 MG PO CAPS
300.0000 mg | ORAL_CAPSULE | Freq: Once | ORAL | Status: AC
  Administered 2011-04-07: 300 mg via ORAL
  Filled 2011-04-07: qty 1

## 2011-04-07 MED ORDER — PROPARACAINE HCL 0.5 % OP SOLN
1.0000 [drp] | Freq: Once | OPHTHALMIC | Status: AC
  Administered 2011-04-07: 1 [drp] via OPHTHALMIC
  Filled 2011-04-07: qty 15

## 2011-04-07 NOTE — ED Notes (Signed)
Pt present to ed with left eye swelling x 1 day.  Pt states "went to sleep and woke up with a swollen eye."  Pt denies itching, swelling, pain or drainage.  Pt purchased "stye eye cream"  From pharmacy and had no relief.  Pt periorbital swelling and mild reddened sclera.

## 2011-04-07 NOTE — ED Notes (Signed)
Pt woke up with a swollen eye, bought OTC stye ointment and it didn't help, no drainage noted

## 2011-04-07 NOTE — ED Provider Notes (Signed)
History     CSN: [id number]  Arrival date & time 04/07/11  1953   First MD Initiated Contact with Patient 04/07/11 2043      Chief Complaint  Patient presents with  . Eye Problem    left eye swollen    (Consider location/radiation/quality/duration/timing/severity/associated sxs/prior treatment) HPI Comments: Patient reports that earlier today she noticed some swelling and erythema of her left upper eye lid and the area beneath her left eye brow.  No discharge from the eye.  No changes in vision.  No eye pain.  She tried putting an OTC stye ointment on it and tried taking two Benadryl, but did not see any improvement.  No swelling or redness of the right eye.  Patient is a 26 y.o. female presenting with eye problem. The history is provided by the patient.  Eye Problem  This is a new problem. The problem has been gradually worsening. There is pain in the left eye. There was no injury mechanism. The patient is experiencing no pain. There is no known exposure to pink eye. She does not wear contacts. Pertinent negatives include no blurred vision, no decreased vision, no discharge, no double vision, no foreign body sensation, no photophobia, no eye redness, no vomiting and no itching.    Past Medical History  Diagnosis Date  . No pertinent past medical history     Past Surgical History  Procedure Date  . No past surgeries     History reviewed. No pertinent family history.  History  Substance Use Topics  . Smoking status: Never Smoker   . Smokeless tobacco: Not on file  . Alcohol Use: No    OB History    Grav Para Term Preterm Abortions TAB SAB Ect Mult Living   4 1 1  3 2  1  1       Review of Systems  Constitutional: Negative for fever and chills.  HENT: Negative for sneezing.   Eyes: Negative for blurred vision, double vision, photophobia, pain, discharge, redness, itching and visual disturbance.  Respiratory: Negative for shortness of breath.   Gastrointestinal:  Negative for vomiting.  Skin: Negative for itching.  Neurological: Negative for dizziness, light-headedness and headaches.    Allergies  Review of patient's allergies indicates no known allergies.  Home Medications  No current outpatient prescriptions on file.  BP 120/70  Pulse 88  Temp(Src) 98.6 F (37 C) (Oral)  Resp 20  Wt 145 lb (65.772 kg)  SpO2 100%  LMP 04/01/2011  Physical Exam  Nursing note and vitals reviewed. Constitutional: She is oriented to person, place, and time. She appears well-developed and well-nourished. No distress.  HENT:  Head: Normocephalic and atraumatic.  Mouth/Throat: Oropharynx is clear and moist.  Eyes: Conjunctivae and EOM are normal. Pupils are equal, round, and reactive to light. No foreign bodies found. Right eye exhibits no discharge, no exudate and no hordeolum. No foreign body present in the right eye. Left eye exhibits no discharge, no exudate and no hordeolum. No foreign body present in the left eye. Right conjunctiva is not injected. Left conjunctiva is not injected. No scleral icterus.  Slit lamp exam:      The left eye shows no fluorescein uptake.       Swelling and erythema of the upper eye lid and periorbital area inferior to the eye brow.  No edema inferior to the eye. No pain with EOM. No proptosis.   Visual acuity 20/25 right, 20/20 left  Cardiovascular: Normal rate, regular rhythm  and normal heart sounds.   Pulmonary/Chest: Effort normal and breath sounds normal.  Neurological: She is alert and oriented to person, place, and time.  Skin: Skin is warm and dry. She is not diaphoretic.  Psychiatric: She has a normal mood and affect.    ED Course  Procedures (including critical care time)  Labs Reviewed - No data to display No results found.   1. Periorbital cellulitis       MDM  Patient comes in with periorbital edema and erythema that began this morning.  Pt is not having any pain.  No pain with EOM.  No proptosis.   EOM intact.  PERRLA.  Afebrile. Normal visual acuity.   Therefore, doubt orbital cellulitis.  Fluorescein stain normal.  No evidence of foreign body.  Feel that symptoms most consistent with periorbital cellulitis.  Patient given Rx for Clindamycin and instructed to follow up in 24 hours for reevaluation to ensure that swelling and erythema is improving.        Pascal Lux [place], PA-C 04/08/11 0159

## 2011-04-07 NOTE — Discharge Instructions (Signed)
Follow up with your primary care physician Thursday morning to have your eye rechecked  Periorbital Cellulitis Periorbital cellulitis is a common infection that can affect the eyelid and the soft tissues that surround the eyeball. The infection may also affect the structures that produce and drain tears. It does not affect the eyeball itself. Natural tissue barriers usually prevent the spread of this infection to the eyeball and other deeper areas of the eye socket.  CAUSES  Bacterial infection.   Long-term (chronic) sinus infections.   An object (foreign body) stuck behind the eye.   An injury that goes through the eyelid tissues.   An injury that causes an infection, such as an insect sting.   Fracture of the bone around the eye.   Infections which have spread from the eyelid or other structures around the eye.   Bite wounds.   Inflammation or infection of the lining membranes of the brain (meningitis).   An infection in the blood (septicemia).   Dental infection (abscess).   Viral infection (this is rare).  SYMPTOMS Symptoms usually come on suddenly.  Pain in the eye.   Red, hot, and swollen eyelids and possibly cheeks. The swelling is sometimes bad enough that the eyelids cannot open. Some infections make the eyelids look purple.   Fever and feeling generally ill.   Pain when touching the area around the eye.  DIAGNOSIS  Periorbital cellulitis can be diagnosed from an eye exam. In severe cases, your caregiver might suggest:  Blood tests.   Imaging tests (such as a CT scan) to examine the sinuses and the area around and behind the eyeball.  TREATMENT If your caregiver feels that you do not have any signs of serious infection, treatment may include:  Antibiotics.   Nasal decongestants to reduce swelling.   Referral to a dentist if it is suspected that the infection was caused by a prior tooth infection.   Examination every day to make sure the problem is  improving.  HOME CARE INSTRUCTIONS  Take your antibiotics as directed. Finish them even if you start to feel better.   Some pain is normal with this condition. Take pain medicine as directed by your caregiver. Only take pain medicines approved by your caregiver.   It is important to drink fluids. Drink enough water and fluids to keep your urine clear or pale yellow.   Do not smoke.   Rest and get plenty of sleep.   Mild or moderate fevers generally have no long-term effects and often do not require treatment.   If your caregiver has given you a follow-up appointment, it is very important to keep that appointment. Your caregiver will need to make sure that the infection is getting better. It is important to check that a more serious infection is not developing.  SEEK IMMEDIATE MEDICAL CARE IF:  Your eyelids become more painful, red, warm, or swollen.   You develop double vision or your vision becomes blurred or worsens in any way.   You have trouble moving your eyes.   The eye looks like it is popping out (proptosis).   You develop a severe headache, severe neck pain, or neck stiffness.   You develop repeated vomiting.   You have a fever or persistent symptoms for more than 72 hours.   You have a fever and your symptoms suddenly get worse.  MAKE SURE YOU:  Understand these instructions.   Will watch your condition.   Will get help right away if  you are not doing well or get worse.  Document Released: 01/24/2010 Document Revised: 12/11/2010 Document Reviewed: 01/24/2010 [facility] Patient Information 2012 [place].

## 2011-04-08 NOTE — ED Provider Notes (Signed)
Medical screening examination/treatment/procedure(s) were performed by non-physician practitioner and as supervising physician I was immediately available for consultation/collaboration.   Dayton Bailiff, MD 04/08/11 1316

## 2012-08-15 ENCOUNTER — Encounter (HOSPITAL_COMMUNITY): Payer: Self-pay

## 2012-08-15 ENCOUNTER — Inpatient Hospital Stay (HOSPITAL_COMMUNITY)
Admission: AD | Admit: 2012-08-15 | Discharge: 2012-08-15 | Disposition: A | Discharge status: Home / Self Care | Payer: Self-pay | Source: Ambulatory Visit | Attending: Obstetrics & Gynecology | Admitting: Obstetrics & Gynecology

## 2012-08-15 DIAGNOSIS — R109 Unspecified abdominal pain: Secondary | ICD-10-CM | POA: Insufficient documentation

## 2012-08-15 DIAGNOSIS — Z3202 Encounter for pregnancy test, result negative: Secondary | ICD-10-CM | POA: Insufficient documentation

## 2012-08-15 HISTORY — DX: Herpesviral infection of urogenital system, unspecified: A60.00

## 2012-08-15 LAB — CBC
HCT: 40.9 % (ref 36.0–46.0)
Hemoglobin: 13.9 g/dL (ref 12.0–15.0)
MCH: 29.1 pg (ref 26.0–34.0)
MCHC: 34 g/dL (ref 30.0–36.0)
RDW: 12.5 % (ref 11.5–15.5)

## 2012-08-15 LAB — URINALYSIS, ROUTINE W REFLEX MICROSCOPIC
Glucose, UA: NEGATIVE mg/dL
Ketones, ur: NEGATIVE mg/dL
Leukocytes, UA: NEGATIVE
Protein, ur: NEGATIVE mg/dL
Urobilinogen, UA: 0.2 mg/dL (ref 0.0–1.0)

## 2012-08-15 LAB — COMPREHENSIVE METABOLIC PANEL
BUN: 8 mg/dL (ref 6–23)
Calcium: 9.4 mg/dL (ref 8.4–10.5)
GFR calc Af Amer: >90 mL/min (ref >90)
Glucose, Bld: 95 mg/dL (ref 70–99)
Sodium: 137 mEq/L (ref 135–145)
Total Protein: 6.4 g/dL (ref 6.0–8.3)

## 2012-08-15 NOTE — MAU Note (Signed)
Right mid abdominal pain that's sharp x 2 months. More frequent for the last few days. Denies vaginal bleeding or discharge. Last depo shot in February 2014; hasn't had period in over a year. Denies n/v/d.

## 2012-08-15 NOTE — MAU Provider Note (Signed)
Attestation of Attending Supervision of Advanced Practitioner (PA/CNM/NP): Evaluation and management procedures were performed by the Advanced Practitioner under my supervision and collaboration.  I have reviewed the Advanced Practitioner's note and chart, and I agree with the management and plan.  Delanna Blacketer, MD, FACOG Attending Obstetrician & Gynecologist Faculty Practice, [facility]  

## 2012-08-15 NOTE — MAU Provider Note (Signed)
History     CSN: [id number]  Arrival date and time: 08/15/12 1008   First Provider Initiated Contact with Patient 08/15/12 1039      Chief Complaint  Patient presents with  . Abdominal Pain   HPI [name] is a 27 y.o. [phone] who presents to MAU today with complaint of off/on mid right-sided abdominal pain x 2 months. She denies N/V/D or constipation, headache, GI issues, vaginal bleeding, discharge or fever. She states that she was on Depo provera recently and last injection was given 02/2012. She has not had a period in about 1 year while on Depo Provera. She denies dysuria, but states that she feel that she may have decreased urine output. She denies pain now, but states that it can be 9/10 at the worst. She does not take pain medication.   OB History   Grav Para Term Preterm Abortions TAB SAB Ect Mult Living   4 1 1  3 2  1  1       Past Medical History  Diagnosis Date  . Herpes genitalia     last outbreak 2013    Past Surgical History  Procedure Laterality Date  . No past surgeries      Family History  Problem Relation Age of Onset  . Asthma Daughter   . Cancer Maternal Grandfather     prostate  . Heart disease Paternal Grandfather     History  Substance Use Topics  . Smoking status: Never Smoker   . Smokeless tobacco: Not on file  . Alcohol Use: No    Allergies: No Known Allergies  No prescriptions prior to admission    Review of Systems  Constitutional: Negative for fever and malaise/fatigue.  Gastrointestinal: Positive for abdominal pain. Negative for nausea, vomiting, diarrhea and constipation.  Genitourinary: Negative for dysuria, urgency and frequency.       Neg - vaginal bleeding, discharge  Neurological: Negative for headaches.   Physical Exam   Blood pressure 109/64, pulse 80, temperature 98.6 F (37 C), temperature source Oral, resp. rate 18, height 5\' 6"  (1.676 m), weight 163 lb 12.8 oz (74.299 kg), SpO2 98.00%.  Physical  Exam  Constitutional: She is oriented to person, place, and time. She appears well-developed and well-nourished. No distress.  HENT:  Head: Normocephalic and atraumatic.  Cardiovascular: Normal rate, regular rhythm and normal heart sounds.   Respiratory: Effort normal and breath sounds normal. No respiratory distress.  GI: Soft. Bowel sounds are normal. She exhibits no distension and no mass. There is no tenderness. There is no rebound and no guarding.  Neurological: She is alert and oriented to person, place, and time.  Skin: Skin is warm and dry. No erythema.  Psychiatric: She has a normal mood and affect.   Results for orders placed during the hospital encounter of 08/15/12 (from the past 24 hour(s))  URINALYSIS, ROUTINE W REFLEX MICROSCOPIC     Status: None   Collection Time    08/15/12 10:12 AM      Result Value Range   Color, Urine YELLOW  YELLOW   APPearance CLEAR  CLEAR   Specific Gravity, Urine 1.015  1.005 - 1.030   pH 6.0  5.0 - 8.0   Glucose, UA NEGATIVE  NEGATIVE mg/dL   Hgb urine dipstick NEGATIVE  NEGATIVE   Bilirubin Urine NEGATIVE  NEGATIVE   Ketones, ur NEGATIVE  NEGATIVE mg/dL   Protein, ur NEGATIVE  NEGATIVE mg/dL   Urobilinogen, UA 0.2  0.0 -  1.0 mg/dL   Nitrite NEGATIVE  NEGATIVE   Leukocytes, UA NEGATIVE  NEGATIVE  POCT PREGNANCY, URINE     Status: None   Collection Time    08/15/12 10:23 AM      Result Value Range   Preg Test, Ur NEGATIVE  NEGATIVE  CBC     Status: None   Collection Time    08/15/12 11:00 AM      Result Value Range   WBC 5.5  4.0 - 10.5 K/uL   RBC 4.77  3.87 - 5.11 MIL/uL   Hemoglobin 13.9  12.0 - 15.0 g/dL   HCT 40.9  81.1 - 91.4 %   MCV 85.7  78.0 - 100.0 fL   MCH 29.1  26.0 - 34.0 pg   MCHC 34.0  30.0 - 36.0 g/dL   RDW 78.2  95.6 - 21.3 %   Platelets 250  150 - 400 K/uL  COMPREHENSIVE METABOLIC PANEL     Status: Abnormal   Collection Time    08/15/12 11:00 AM      Result Value Range   Sodium 137  135 - 145 mEq/L    Potassium 3.4 (*) 3.5 - 5.1 mEq/L   Chloride 105  96 - 112 mEq/L   CO2 24  19 - 32 mEq/L   Glucose, Bld 95  70 - 99 mg/dL   BUN 8  6 - 23 mg/dL   Creatinine, Ser 0.86  0.50 - 1.10 mg/dL   Calcium 9.4  8.4 - 57.8 mg/dL   Total Protein 6.4  6.0 - 8.3 g/dL   Albumin 3.5  3.5 - 5.2 g/dL   AST 16  0 - 37 U/L   ALT 15  0 - 35 U/L   Alkaline Phosphatase 112  39 - 117 U/L   Total Bilirubin 0.4  0.3 - 1.2 mg/dL   GFR calc non Af Amer >90  >90 mL/min   GFR calc Af Amer >90  >90 mL/min    MAU Course  Procedures None  MDM UPT - negative UA, CBC, CMP today Labs are grossly normal, patient denies pain at this time.   Assessment and Plan  A: Abdominal pain probably secondary to gas or other GI source  P: Discharge home Patient advised to keep a log of when symptoms present and follow-up with [facility] if a pattern arises or symptoms worsen or fail to improve Patient may return to MAU as needed or if her condition were to change or worsen  Freddi Starr, PA-C  08/15/2012, 2:28 PM

## 2012-11-09 ENCOUNTER — Encounter (HOSPITAL_COMMUNITY): Payer: Self-pay | Admitting: Emergency Medicine

## 2012-11-09 ENCOUNTER — Emergency Department (HOSPITAL_COMMUNITY): Payer: Self-pay

## 2012-11-09 ENCOUNTER — Emergency Department (HOSPITAL_COMMUNITY)
Admission: EM | Admit: 2012-11-09 | Discharge: 2012-11-09 | Disposition: A | Discharge status: Home / Self Care | Payer: Self-pay | Attending: Emergency Medicine | Admitting: Emergency Medicine

## 2012-11-09 DIAGNOSIS — K5900 Constipation, unspecified: Secondary | ICD-10-CM | POA: Insufficient documentation

## 2012-11-09 DIAGNOSIS — Z3202 Encounter for pregnancy test, result negative: Secondary | ICD-10-CM | POA: Insufficient documentation

## 2012-11-09 DIAGNOSIS — R1031 Right lower quadrant pain: Secondary | ICD-10-CM | POA: Insufficient documentation

## 2012-11-09 DIAGNOSIS — R112 Nausea with vomiting, unspecified: Secondary | ICD-10-CM | POA: Insufficient documentation

## 2012-11-09 DIAGNOSIS — R109 Unspecified abdominal pain: Secondary | ICD-10-CM

## 2012-11-09 DIAGNOSIS — Z8619 Personal history of other infectious and parasitic diseases: Secondary | ICD-10-CM | POA: Insufficient documentation

## 2012-11-09 LAB — COMPREHENSIVE METABOLIC PANEL
ALT: 11 U/L (ref 0–35)
AST: 16 U/L (ref 0–37)
Alkaline Phosphatase: 131 U/L — ABNORMAL HIGH (ref 39–117)
BUN: 9 mg/dL (ref 6–23)
CO2: 27 mEq/L (ref 19–32)
Chloride: 104 mEq/L (ref 96–112)
Creatinine, Ser: 0.96 mg/dL (ref 0.50–1.10)
GFR calc non Af Amer: 80 mL/min — ABNORMAL LOW (ref >90)
Potassium: 3.5 mEq/L (ref 3.5–5.1)
Total Bilirubin: 0.5 mg/dL (ref 0.3–1.2)
Total Protein: 7.5 g/dL (ref 6.0–8.3)

## 2012-11-09 LAB — URINALYSIS, ROUTINE W REFLEX MICROSCOPIC
Bilirubin Urine: NEGATIVE
Glucose, UA: NEGATIVE mg/dL
Hgb urine dipstick: NEGATIVE
Ketones, ur: NEGATIVE mg/dL
Leukocytes, UA: NEGATIVE
Protein, ur: NEGATIVE mg/dL
pH: 6 (ref 5.0–8.0)

## 2012-11-09 LAB — CBC WITH DIFFERENTIAL/PLATELET
Basophils Absolute: 0 10*3/uL (ref 0.0–0.1)
HCT: 40.3 % (ref 36.0–46.0)
Hemoglobin: 13.7 g/dL (ref 12.0–15.0)
Lymphocytes Relative: 36 % (ref 12–46)
MCV: 86.3 fL (ref 78.0–100.0)
Monocytes Absolute: 0.4 10*3/uL (ref 0.1–1.0)
Monocytes Relative: 6 % (ref 3–12)
Neutro Abs: 4 10*3/uL (ref 1.7–7.7)
Neutrophils Relative %: 56 % (ref 43–77)
WBC: 7.1 10*3/uL (ref 4.0–10.5)

## 2012-11-09 LAB — POCT PREGNANCY, URINE: Preg Test, Ur: NEGATIVE

## 2012-11-09 MED ORDER — TRAMADOL HCL 50 MG PO TABS: 50.0000 mg | ORAL_TABLET | Freq: Four times a day (QID) | ORAL | Status: DC | PRN

## 2012-11-09 MED ORDER — IOHEXOL 300 MG/ML  SOLN
50.0000 mL | Freq: Once | INTRAMUSCULAR | Status: AC | PRN
  Administered 2012-11-09: 50 mL via ORAL

## 2012-11-09 MED ORDER — IOHEXOL 300 MG/ML  SOLN
100.0000 mL | Freq: Once | INTRAMUSCULAR | Status: AC | PRN
  Administered 2012-11-09: 100 mL via INTRAVENOUS

## 2012-11-09 MED ORDER — DICYCLOMINE HCL 20 MG PO TABS: 20.0000 mg | ORAL_TABLET | Freq: Two times a day (BID) | ORAL | Status: DC

## 2012-11-09 NOTE — ED Notes (Signed)
Pt c/o abd pain and low back pain x2 days.  Reports that she felt constipated x1 week and starting taking laxatives.  Now pt feels nauseated and constipated, with abd pain. Reports being unable to keep anything down.

## 2012-11-09 NOTE — Progress Notes (Signed)
Patient confirms she is seen at [facility] for her medical and obgyn needs.  Patient reports she is not seen by any particular doctor.

## 2012-11-09 NOTE — ED Provider Notes (Signed)
CSN: [id number]     Arrival date & time 11/09/12  1329 History   First MD Initiated Contact with Patient 11/09/12 1609     Chief Complaint  Patient presents with  . Abdominal Pain   (Consider location/radiation/quality/duration/timing/severity/associated sxs/prior Treatment) HPI Comments: Patient presents to the ER for evaluation of abdominal discomfort for one week. She reports it started one week ago with feeling like she was constipated. She took laxatives without improvement. She is still felt constipated but over the last couple of days has now developed crampy pain in the lower abdomen right greater than left and the lower back, left greater than right. She has had nausea and vomiting. She has not noticed any fever. There is no urinary symptom. She does not have any vaginal discharge or bleeding.  Patient is a 27 y.o. female presenting with abdominal pain.  Abdominal Pain Associated symptoms: constipation, nausea and vomiting   Associated symptoms: no chest pain, no fever and no shortness of breath     Past Medical History  Diagnosis Date  . Herpes genitalia     last outbreak 2013   Past Surgical History  Procedure Laterality Date  . No past surgeries     Family History  Problem Relation Age of Onset  . Asthma Daughter   . Cancer Maternal Grandfather     prostate  . Heart disease Paternal Grandfather    History  Substance Use Topics  . Smoking status: Never Smoker   . Smokeless tobacco: Not on file  . Alcohol Use: No   OB History   Grav Para Term Preterm Abortions TAB SAB Ect Mult Living   4 1 1  3 2  1  1      Review of Systems  Constitutional: Negative for fever.  Respiratory: Negative for shortness of breath.   Cardiovascular: Negative for chest pain.  Gastrointestinal: Positive for nausea, vomiting, abdominal pain and constipation.  Genitourinary: Negative.   All other systems reviewed and are negative.    Allergies  Review of patient's allergies  indicates no known allergies.  Home Medications   Current Outpatient Rx  Name  Route  Sig  Dispense  Refill  . Bisacodyl (DULCOLAX PO)   Oral   Take 1 tablet by mouth daily as needed (constipation).         . diphenhydramine-acetaminophen (TYLENOL PM) 25-500 MG TABS   Oral   Take 1 tablet by mouth at bedtime as needed (sleep).         . Multiple Vitamin (MULTIVITAMIN WITH MINERALS) TABS tablet   Oral   Take 1 tablet by mouth daily.          BP 119/81  Pulse 82  Temp(Src) 98.4 F (36.9 C) (Oral)  Resp 14  SpO2 100%  LMP 10/15/2012 Physical Exam  Constitutional: She is oriented to person, place, and time. She appears well-developed and well-nourished. No distress.  HENT:  Head: Normocephalic and atraumatic.  Right Ear: Hearing normal.  Left Ear: Hearing normal.  Nose: Nose normal.  Mouth/Throat: Oropharynx is clear and moist and mucous membranes are normal.  Eyes: Conjunctivae and EOM are normal. Pupils are equal, round, and reactive to light.  Neck: Normal range of motion. Neck supple.  Cardiovascular: Regular rhythm, S1 normal and S2 normal.  Exam reveals no gallop and no friction rub.   No murmur heard. Pulmonary/Chest: Effort normal and breath sounds normal. No respiratory distress. She exhibits no tenderness.  Abdominal: Soft. Normal appearance and bowel sounds are  normal. There is no hepatosplenomegaly. There is tenderness in the right lower quadrant. There is no rebound, no guarding, no tenderness at McBurney's point and negative Murphy's sign. No hernia.  Musculoskeletal: Normal range of motion.  Neurological: She is alert and oriented to person, place, and time. She has normal strength. No cranial nerve deficit or sensory deficit. Coordination normal. GCS eye subscore is 4. GCS verbal subscore is 5. GCS motor subscore is 6.  Skin: Skin is warm, dry and intact. No rash noted. No cyanosis.  Psychiatric: She has a normal mood and affect. Her speech is normal and  behavior is normal. Thought content normal.    ED Course  Procedures (including critical care time) Labs Review Labs Reviewed  COMPREHENSIVE METABOLIC PANEL - Abnormal; Notable for the following:    Alkaline Phosphatase 131 (*)    GFR calc non Af Amer 80 (*)    All other components within normal limits  CBC WITH DIFFERENTIAL  LIPASE, BLOOD  URINALYSIS, ROUTINE W REFLEX MICROSCOPIC  POCT PREGNANCY, URINE   Imaging Review Ct Abdomen Pelvis W Contrast  11/09/2012   CLINICAL DATA:  Abdominal and low back pain for 2 days. Recent constipation.  EXAM: CT ABDOMEN AND PELVIS WITH CONTRAST  TECHNIQUE: Multidetector CT imaging of the abdomen and pelvis was performed using the standard protocol following bolus administration of intravenous contrast.  CONTRAST:  OMNIPAQUE IOHEXOL 300 MG/ML  SOLN  COMPARISON:  07/14/2008  FINDINGS: Lower Chest: Clear lung bases. Normal heart size without pericardial or pleural effusion.  Abdomen/Pelvis: Normal liver, spleen, stomach, pancreas, adrenal glands. The gallbladder is contracted, without surrounding inflammation. No biliary ductal dilatation.  Normal kidneys. No retroperitoneal or retrocrural adenopathy.  The descending and sigmoid colon or underdistended. Normal terminal ileum. Normal appendix, including on image 52/ series 2.  Normal small bowel without abdominal ascites.  No pelvic adenopathy. Normal urinary bladder. Retroverted uterus. No significant free fluid. A right ovarian dominant follicle at 1.7 cm. .  Bones/Musculoskeletal:  Transitional L5 vertebral body.  IMPRESSION: No acute process in the abdomen or pelvis.   Electronically Signed   By: Jeronimo Greaves M.D.   On: 11/09/2012 17:18    EKG Interpretation   None       MDM  Diagnosis: Abdominal pain  Patient presents to the ER for evaluation of abdominal pain. She initially felt constipated, took laxatives and now has had 2 days of lower abdominal pain and cramping. Exam is benign. Lab work  was unremarkable. Urinalysis was no sign of infection, no hematuria. CAT scan was performed to further evaluate. No acute abnormality is seen. I suspect the patient's pain is secondary to laxatives. She was told to stop the lactulose was given Ultram for pain and Bentyl for cramping.    Gilda Crease, MD 11/09/12 [id number]

## 2012-11-10 ENCOUNTER — Emergency Department (HOSPITAL_COMMUNITY): Payer: Self-pay

## 2012-11-10 ENCOUNTER — Emergency Department (HOSPITAL_COMMUNITY)
Admission: EM | Admit: 2012-11-10 | Discharge: 2012-11-10 | Disposition: A | Discharge status: Home / Self Care | Payer: Self-pay | Attending: Emergency Medicine | Admitting: Emergency Medicine

## 2012-11-10 DIAGNOSIS — S0990XA Unspecified injury of head, initial encounter: Secondary | ICD-10-CM | POA: Insufficient documentation

## 2012-11-10 DIAGNOSIS — Z8619 Personal history of other infectious and parasitic diseases: Secondary | ICD-10-CM | POA: Insufficient documentation

## 2012-11-10 DIAGNOSIS — Z79899 Other long term (current) drug therapy: Secondary | ICD-10-CM | POA: Insufficient documentation

## 2012-11-10 DIAGNOSIS — Y998 Other external cause status: Secondary | ICD-10-CM | POA: Insufficient documentation

## 2012-11-10 DIAGNOSIS — Y9389 Activity, other specified: Secondary | ICD-10-CM | POA: Insufficient documentation

## 2012-11-10 DIAGNOSIS — Y92410 Unspecified street and highway as the place of occurrence of the external cause: Secondary | ICD-10-CM | POA: Insufficient documentation

## 2012-11-10 DIAGNOSIS — IMO0002 Reserved for concepts with insufficient information to code with codable children: Secondary | ICD-10-CM | POA: Insufficient documentation

## 2012-11-10 MED ORDER — ACETAMINOPHEN 500 MG PO TABS
1000.0000 mg | ORAL_TABLET | Freq: Once | ORAL | Status: AC
  Administered 2012-11-10: 1000 mg via ORAL
  Filled 2012-11-10: qty 2
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
## 2012-11-10 NOTE — ED Provider Notes (Signed)
Medical screening examination/treatment/procedure(s) were performed by non-physician practitioner and as supervising physician I was immediately available for consultation/collaboration.  EKG Interpretation   None         Benny Lennert, MD 11/10/12 2246

## 2012-12-02 ENCOUNTER — Emergency Department (HOSPITAL_COMMUNITY)
Admission: EM | Admit: 2012-12-02 | Discharge: 2012-12-02 | Disposition: A | Discharge status: Home / Self Care | Payer: Self-pay | Attending: Emergency Medicine | Admitting: Emergency Medicine

## 2012-12-02 ENCOUNTER — Encounter (HOSPITAL_COMMUNITY): Payer: Self-pay | Admitting: Emergency Medicine

## 2012-12-02 ENCOUNTER — Emergency Department (HOSPITAL_COMMUNITY): Payer: Self-pay

## 2012-12-02 DIAGNOSIS — Z79899 Other long term (current) drug therapy: Secondary | ICD-10-CM | POA: Insufficient documentation

## 2012-12-02 DIAGNOSIS — Z8619 Personal history of other infectious and parasitic diseases: Secondary | ICD-10-CM | POA: Insufficient documentation

## 2012-12-02 DIAGNOSIS — R569 Unspecified convulsions: Secondary | ICD-10-CM | POA: Insufficient documentation

## 2012-12-02 DIAGNOSIS — Y929 Unspecified place or not applicable: Secondary | ICD-10-CM | POA: Insufficient documentation

## 2012-12-02 DIAGNOSIS — Z3202 Encounter for pregnancy test, result negative: Secondary | ICD-10-CM | POA: Insufficient documentation

## 2012-12-02 DIAGNOSIS — W503XXA Accidental bite by another person, initial encounter: Secondary | ICD-10-CM | POA: Insufficient documentation

## 2012-12-02 DIAGNOSIS — Y939 Activity, unspecified: Secondary | ICD-10-CM | POA: Insufficient documentation

## 2012-12-02 DIAGNOSIS — IMO0002 Reserved for concepts with insufficient information to code with codable children: Secondary | ICD-10-CM | POA: Insufficient documentation

## 2012-12-02 LAB — URINALYSIS, ROUTINE W REFLEX MICROSCOPIC
Bilirubin Urine: NEGATIVE
Glucose, UA: NEGATIVE mg/dL
Hgb urine dipstick: NEGATIVE
Ketones, ur: NEGATIVE mg/dL
Leukocytes, UA: NEGATIVE
Protein, ur: NEGATIVE mg/dL
Urobilinogen, UA: 1 mg/dL (ref 0.0–1.0)

## 2012-12-02 LAB — POCT I-STAT, CHEM 8
BUN: 8 mg/dL (ref 6–23)
Calcium, Ion: 1.26 mmol/L — ABNORMAL HIGH (ref 1.12–1.23)
Creatinine, Ser: 1.1 mg/dL (ref 0.50–1.10)
Hemoglobin: 16 g/dL — ABNORMAL HIGH (ref 12.0–15.0)
Sodium: 141 mEq/L (ref 135–145)
TCO2: 23 mmol/L (ref 0–100)

## 2012-12-02 LAB — RAPID URINE DRUG SCREEN, HOSP PERFORMED
Amphetamines: NOT DETECTED
Barbiturates: NOT DETECTED
Benzodiazepines: NOT DETECTED
Cocaine: NOT DETECTED
Tetrahydrocannabinol: NOT DETECTED

## 2012-12-02 LAB — CBC WITH DIFFERENTIAL/PLATELET
Basophils Relative: 0 % (ref 0–1)
Eosinophils Absolute: 0.1 10*3/uL (ref 0.0–0.7)
Eosinophils Relative: 1 % (ref 0–5)
HCT: 43.3 % (ref 36.0–46.0)
Hemoglobin: 15.5 g/dL — ABNORMAL HIGH (ref 12.0–15.0)
Lymphs Abs: 1.5 10*3/uL (ref 0.7–4.0)
MCH: 30.5 pg (ref 26.0–34.0)
MCHC: 35.8 g/dL (ref 30.0–36.0)
MCV: 85.1 fL (ref 78.0–100.0)
Monocytes Absolute: 0.6 10*3/uL (ref 0.1–1.0)
Monocytes Relative: 6 % (ref 3–12)
Neutrophils Relative %: 78 % — ABNORMAL HIGH (ref 43–77)
RBC: 5.09 MIL/uL (ref 3.87–5.11)

## 2012-12-02 LAB — ETHANOL: Alcohol, Ethyl (B): <11 mg/dL (ref 0–11)

## 2012-12-02 LAB — POCT PREGNANCY, URINE: Preg Test, Ur: NEGATIVE

## 2012-12-02 NOTE — ED Notes (Signed)
Upon speaking with pt. She has no complaints at this time.  Pt. Is noted to have bitten the right side of her lip. Pt. denies any new medications, foods, or drugs.
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
## 2012-12-02 NOTE — ED Notes (Signed)
PER EMS:  Per Family pt. Was found on the floor tensed up shaking. Pt. Has no PMH of this.

## 2012-12-04 NOTE — ED Provider Notes (Signed)
History/physical exam/procedure(s) were performed by non-physician practitioner and as supervising physician I was immediately available for consultation/collaboration. I have reviewed all notes and am in agreement with care and plan. 27 y.o. Female with probable new onset of seizures from history.  Patient's care discussed extensively and with neurologist.  Patient will not be started on antiseizure meds at this time.  She is placed on seizure precautions and is not to drive.  She is given close follow up.   Hilario Quarry, MD 12/04/12 [phone]

## 2013-01-05 NOTE — L&D Delivery Note (Signed)
Patient is 28 y.o. [id number] admitted in active labor   Delivery Note At 2:18 PM a viable female was delivered via Vaginal, Spontaneous Delivery (Presentation: Left Occiput Anterior).  APGAR: 9, 9; weight 7 lb 1.9 oz (3229 g).   Placenta status: Intact, Spontaneous Manual removal of trailing membranes via ring forceps.  Cord: 3 vessels with the following complications: None.   Anesthesia: None  Episiotomy: None Lacerations: bilateral labial abrasions Suture Repair: n/a Est. Blood Loss (mL): 300  Mom to postpartum.  Baby to Couplet care / Skin to Skin.  No fever intrapartum, febrile postpartum.  Will give 1g tylenol, monitor.  No diagnosis of chorio or endometritis at this time.  Zyniah Ferraiolo ROCIO 11/28/2013, 4:21 PM

## 2013-04-05 ENCOUNTER — Inpatient Hospital Stay (HOSPITAL_COMMUNITY): Payer: Medicaid Other

## 2013-04-05 ENCOUNTER — Encounter (HOSPITAL_COMMUNITY): Payer: Self-pay | Admitting: *Deleted

## 2013-04-05 ENCOUNTER — Inpatient Hospital Stay (HOSPITAL_COMMUNITY)
Admission: AD | Admit: 2013-04-05 | Discharge: 2013-04-05 | Disposition: A | Discharge status: Home / Self Care | Payer: Self-pay | Source: Ambulatory Visit | Attending: Obstetrics & Gynecology | Admitting: Obstetrics & Gynecology

## 2013-04-05 DIAGNOSIS — A499 Bacterial infection, unspecified: Secondary | ICD-10-CM | POA: Insufficient documentation

## 2013-04-05 DIAGNOSIS — B9689 Other specified bacterial agents as the cause of diseases classified elsewhere: Secondary | ICD-10-CM | POA: Insufficient documentation

## 2013-04-05 DIAGNOSIS — O209 Hemorrhage in early pregnancy, unspecified: Secondary | ICD-10-CM

## 2013-04-05 DIAGNOSIS — N760 Acute vaginitis: Secondary | ICD-10-CM | POA: Insufficient documentation

## 2013-04-05 DIAGNOSIS — O2390 Unspecified genitourinary tract infection in pregnancy, unspecified trimester: Secondary | ICD-10-CM | POA: Insufficient documentation

## 2013-04-05 DIAGNOSIS — O200 Threatened abortion: Secondary | ICD-10-CM | POA: Insufficient documentation

## 2013-04-05 HISTORY — DX: Unspecified ectopic pregnancy without intrauterine pregnancy: O00.90

## 2013-04-05 LAB — URINE MICROSCOPIC-ADD ON

## 2013-04-05 LAB — URINALYSIS, ROUTINE W REFLEX MICROSCOPIC
Bilirubin Urine: NEGATIVE
Glucose, UA: NEGATIVE mg/dL
Ketones, ur: NEGATIVE mg/dL
LEUKOCYTES UA: NEGATIVE
NITRITE: NEGATIVE
PH: 6.5 (ref 5.0–8.0)
Protein, ur: NEGATIVE mg/dL
SPECIFIC GRAVITY, URINE: 1.02 (ref 1.005–1.030)
Urobilinogen, UA: 1 mg/dL (ref 0.0–1.0)

## 2013-04-05 LAB — WET PREP, GENITAL
TRICH WET PREP: NONE SEEN
YEAST WET PREP: NONE SEEN

## 2013-04-05 LAB — CBC
HEMATOCRIT: 40.3 % (ref 36.0–46.0)
HEMOGLOBIN: 14 g/dL (ref 12.0–15.0)
MCH: 30.2 pg (ref 26.0–34.0)
MCHC: 34.7 g/dL (ref 30.0–36.0)
MCV: 87 fL (ref 78.0–100.0)
Platelets: 269 10*3/uL (ref 150–400)
RBC: 4.63 MIL/uL (ref 3.87–5.11)
RDW: 12.5 % (ref 11.5–15.5)
WBC: 6.2 10*3/uL (ref 4.0–10.5)

## 2013-04-05 LAB — POCT PREGNANCY, URINE: PREG TEST UR: POSITIVE — AB

## 2013-04-05 LAB — HCG, QUANTITATIVE, PREGNANCY: hCG, Beta Chain, Quant, S: 18414 m[IU]/mL — ABNORMAL HIGH (ref <5)

## 2013-04-05 LAB — HIV ANTIBODY (ROUTINE TESTING W REFLEX): HIV: NONREACTIVE

## 2013-04-05 MED ORDER — METRONIDAZOLE 500 MG PO TABS
500.0000 mg | ORAL_TABLET | Freq: Two times a day (BID) | ORAL | Status: DC
Start: 2013-04-05 — End: 2013-05-27

## 2013-04-05 MED ORDER — PROMETHAZINE HCL 25 MG PO TABS: 25.0000 mg | ORAL_TABLET | Freq: Four times a day (QID) | ORAL | Status: DC | PRN

## 2013-04-05 NOTE — Discharge Instructions (Signed)
Threatened Miscarriage  Bleeding during the first 20 weeks of pregnancy is common. This is sometimes called a threatened miscarriage. This is a pregnancy that is threatening to end before the twentieth week of pregnancy. Often this bleeding stops with bed rest or decreased activities as suggested by your caregiver and the pregnancy continues without any more problems. You may be asked to not have sexual intercourse, have orgasms or use tampons until further notice. Sometimes a threatened miscarriage can progress to a complete or incomplete miscarriage. This may or may not require further treatment. Some miscarriages occur before a woman misses a menstrual period and knows she is pregnant.  Miscarriages occur in 15 to 20% of all pregnancies and usually occur during the first 13 weeks of the pregnancy. The exact cause of a miscarriage is usually never known. A miscarriage is natures way of ending a pregnancy that is abnormal or would not make it to term. There are some things that may put you at risk to have a miscarriage, such as:  · Hormone problems.  · Infection of the uterus or cervix.  · Chronic illness, diabetes for example, especially if it is not controlled.  · Abnormal shaped uterus.  · Fibroids in the uterus.  · Incompetent cervix (the cervix is too weak to hold the baby).  · Smoking.  · Drinking too much alcohol. It's best not to drink any alcohol when you are pregnant.  · Taking illegal drugs.  TREATMENT   When a miscarriage becomes complete and all products of conception (all the tissue in the uterus) have been passed, often no treatment is needed. If you think you passed tissue, save it in a container and take it to your doctor for evaluation. If the miscarriage is incomplete (parts of the fetus or placenta remain in the uterus), further treatment may be needed. The most common reason for further treatment is continued bleeding (hemorrhage) because pregnancy tissue did not pass out of the uterus. This  often occurs if a miscarriage is incomplete. Tissue left behind may also become infected. Treatment usually is dilatation and curettage (the removal of the remaining products of pregnancy. This can be done by a simple sucking procedure (suction curettage) or a simple scraping of the inside of the uterus. This may be done in the hospital or in the caregiver's office. This is only done when your caregiver knows that there is no chance for the pregnancy to proceed to term. This is determined by physical examination, negative pregnancy test, falling pregnancy hormone count and/or, an ultrasound revealing a dead fetus.  Miscarriages are often a very emotional time for prospective mothers and fathers. This is not you or your partners fault. It did not occur because of an inadequacy in you or your partner. Nearly all miscarriages occur because the pregnancy has started off wrongly. At least half of these pregnancies have a chromosomal abnormality. It is almost always not inherited. Others may have developmental problems with the fetus or placenta. This does not always show up even when the products miscarried are studied under the microscope. The miscarriage is nearly always not your fault and it is not likely that you could have prevented it from happening. If you are having emotional and grieving problems, talk to your health care provider and even seek counseling, if necessary, before getting pregnant again. You can begin trying for another pregnancy as soon as your caregiver says it is OK.  HOME CARE INSTRUCTIONS   · Your caregiver may order   bed rest depending on how much bleeding and cramping you are having. You may be limited to only getting up to go to the bathroom. You may be allowed to continue light activity. You may need to make arrangements for the care of your other children and for any other responsibilities.  · Keep track of the number of pads you use each day, how often you have to change pads and how  saturated (soaked) they are. Record this information.  · DO NOT USE TAMPONS. Do not douche, have sexual intercourse or orgasms until approved by your caregiver.  · You may receive a follow up appointment for re-evaluation of your pregnancy and a repeat blood test. Re-evaluation often occurs after 2 days and again in 4 to 6 weeks. It is very important that you follow-up in the recommended time period.  · If you are Rh negative and the father is Rh positive or you do not know the fathers' blood type, you may receive a shot (Rh immune globulin) to help prevent abnormal antibodies that can develop and affect the baby in any future pregnancies.  SEEK IMMEDIATE MEDICAL CARE IF:  · You have severe cramps in your stomach, back, or abdomen.  · You have a sudden onset of severe pain in the lower part of your abdomen.  · You develop chills.  · You run an unexplained temperature of 101° F (38.3° C) or higher.  · You pass large clots or tissue. Save any tissue for your caregiver to inspect.  · Your bleeding increases or you become light-headed, weak, or have fainting episodes.  · You have a gush of fluid from your vagina.  · You pass out. This could mean you have a tubal (ectopic) pregnancy.  Document Released: 12/22/2004 Document Revised: 03/16/2011 Document Reviewed: 08/08/2007  ExitCare® Patient Information ©2014 [employer].

## 2013-04-05 NOTE — MAU Note (Signed)
+   HPT. Has appointment at health dept tomorrow, but could not wait. C/O lower abdominal cramping and pain in lower back, intermittent. Started 3-4 days ago. Spotting and brown discharge off and on for a week or so.

## 2013-04-05 NOTE — MAU Provider Note (Signed)
Attestation of Attending Supervision of Advanced Practitioner (CNM/NP): Evaluation and management procedures were performed by the Advanced Practitioner under my supervision and collaboration.  I have reviewed the Advanced Practitioner's note and chart, and I agree with the management and plan.  HARRAWAY-SMITH, Britton Bera 2:16 PM

## 2013-04-05 NOTE — MAU Provider Note (Signed)
History     CSN: [id number]  Arrival date and time: 04/05/13 4098   First Provider Initiated Contact with Patient 04/05/13 1005      Chief Complaint  Patient presents with  . Dysmenorrhea  . Back Pain   HPI Comments: Amber Mathews 28 y.o. [id number] presents to MAU with cramping x 7 days and spotting x 4 days. By her LMP she is 6 weeks and 4 days pregnant. She has an appointment at [facility] tomorrow for NOB care. Last intercourse was 4 days ago. She has history of ectopic pregnancy resolved with one dose of methotrexate. Blood Type O Positive   Back Pain Associated symptoms include abdominal pain.      Past Medical History  Diagnosis Date  . Herpes genitalia     last outbreak 2013  . Ectopic pregnancy     Had MTX    Past Surgical History  Procedure Laterality Date  . No past surgeries      Family History  Problem Relation Age of Onset  . Asthma Daughter   . Cancer Maternal Grandfather     prostate  . Heart disease Paternal Grandfather     History  Substance Use Topics  . Smoking status: Never Smoker   . Smokeless tobacco: Never Used  . Alcohol Use: No    Allergies: No Known Allergies  Prescriptions prior to admission  Medication Sig Dispense Refill  . Multiple Vitamin (MULTIVITAMIN WITH MINERALS) TABS tablet Take 1 tablet by mouth daily.        Review of Systems  Constitutional: Negative.   HENT: Negative.   Eyes: Negative.   Respiratory: Negative.   Cardiovascular: Negative.   Gastrointestinal: Positive for abdominal pain.  Genitourinary: Negative.   Musculoskeletal: Positive for back pain.  Skin: Negative.   Neurological: Negative.   Psychiatric/Behavioral: Negative.    Physical Exam   Blood pressure 116/63, pulse 84, temperature 98.8 F (37.1 C), temperature source Oral, resp. rate 18, height 5\' 4"  (1.626 m), weight 166 lb (75.297 kg), last menstrual period 02/18/2013.  Physical Exam  Constitutional: She appears well-developed and  well-nourished. No distress.  HENT:  Head: Normocephalic and atraumatic.  Eyes: Conjunctivae are normal. Pupils are equal, round, and reactive to light.  Cardiovascular: Normal rate, regular rhythm and normal heart sounds.   Respiratory: Effort normal and breath sounds normal.  GI: Soft. Bowel sounds are normal. She exhibits no distension and no mass. There is no tenderness. There is no rebound and no guarding.  Genitourinary:  Genital:External: negative Vaginal:pink discharge/ trickle of bright red blood from cervix Cervix:closed/ long Bimanual:nontender uterus    Results for orders placed during the hospital encounter of 04/05/13 (from the past 24 hour(s))  URINALYSIS, ROUTINE W REFLEX MICROSCOPIC     Status: Abnormal   Collection Time    04/05/13  9:52 AM      Result Value Ref Range   Color, Urine YELLOW  YELLOW   APPearance CLEAR  CLEAR   Specific Gravity, Urine 1.020  1.005 - 1.030   pH 6.5  5.0 - 8.0   Glucose, UA NEGATIVE  NEGATIVE mg/dL   Hgb urine dipstick TRACE (*) NEGATIVE   Bilirubin Urine NEGATIVE  NEGATIVE   Ketones, ur NEGATIVE  NEGATIVE mg/dL   Protein, ur NEGATIVE  NEGATIVE mg/dL   Urobilinogen, UA 1.0  0.0 - 1.0 mg/dL   Nitrite NEGATIVE  NEGATIVE   Leukocytes, UA NEGATIVE  NEGATIVE  URINE MICROSCOPIC-ADD ON     Status: None  Collection Time    04/05/13  9:52 AM      Result Value Ref Range   Squamous Epithelial / LPF RARE  RARE   WBC, UA 0-2  <3 WBC/hpf  POCT PREGNANCY, URINE     Status: Abnormal   Collection Time    04/05/13  9:54 AM      Result Value Ref Range   Preg Test, Ur POSITIVE (*) NEGATIVE  WET PREP, GENITAL     Status: Abnormal   Collection Time    04/05/13 10:19 AM      Result Value Ref Range   Yeast Wet Prep HPF POC NONE SEEN  NONE SEEN   Trich, Wet Prep NONE SEEN  NONE SEEN   Clue Cells Wet Prep HPF POC MODERATE (*) NONE SEEN   WBC, Wet Prep HPF POC FEW (*) NONE SEEN  CBC     Status: None   Collection Time    04/05/13 10:44 AM       Result Value Ref Range   WBC 6.2  4.0 - 10.5 K/uL   RBC 4.63  3.87 - 5.11 MIL/uL   Hemoglobin 14.0  12.0 - 15.0 g/dL   HCT 09.840.3  11.936.0 - 14.746.0 %   MCV 87.0  78.0 - 100.0 fL   MCH 30.2  26.0 - 34.0 pg   MCHC 34.7  30.0 - 36.0 g/dL   RDW 82.912.5  56.211.5 - 13.015.5 %   Platelets 269  150 - 400 K/uL  HCG, QUANTITATIVE, PREGNANCY     Status: Abnormal   Collection Time    04/05/13 10:44 AM      Result Value Ref Range   hCG, Beta Chain, Quant, S 18414 (*) <5 mIU/mL   [place] Ob Transvaginal  04/05/2013   CLINICAL DATA:  Pelvic cramping. Vaginal bleeding. Gestational age by LMP of 6 weeks 4 days.  EXAM: TRANSVAGINAL OB ULTRASOUND  TECHNIQUE: Transvaginal ultrasound was performed for complete evaluation of the gestation as well as the maternal uterus, adnexal regions, and pelvic cul-de-sac.  COMPARISON:  None  FINDINGS: Intrauterine gestational sac: Visualized/normal in shape.  Yolk sac:  Visualized  Embryo:  Visualized  Cardiac Activity: Visualized  Heart Rate: 142 bpm  CRL:   9  mm   7 w 0 d                  US EDC: 11/22/2013  Maternal uterus/adnexae: Small subchorionic hemorrhage is noted. Retroverted uterus. No mass or free fluid visualized. Both ovaries are normal in appearance.  IMPRESSION: Single living IUP measuring 7 weeks 0 days with US EDC of 11/22/2013. This is concordant with given LMP.  Small subchorionic hemorrhage.   Electronically Signed   By: Myles RosenthalJohn  Stahl M.D.   On: 04/05/2013 12:03     MAU Course  Procedures  MDM Wet prep, GC, Chlamydia, CBC, UA, U/S, Quant Declines anything for pain  Assessment and Plan   A: Threatened Miscarriage Small SCH Bacterial vaginosis  P: Start PNV daily Establish [facility] care[facility] Flagyl 500 mg po BID X 7 days Phenergan 25 mg po q 6 hours prn Miscarriage precautions Return to MAU as needed   Carolynn ServeBarefoot, Karmelo Bass Miller 04/05/2013, 10:23 AM

## 2013-04-06 LAB — GC/CHLAMYDIA PROBE AMP
CT Probe RNA: NEGATIVE
GC PROBE AMP APTIMA: NEGATIVE

## 2013-05-27 ENCOUNTER — Inpatient Hospital Stay (HOSPITAL_COMMUNITY): Payer: Medicaid Other

## 2013-05-27 ENCOUNTER — Encounter (HOSPITAL_COMMUNITY): Payer: Self-pay | Admitting: *Deleted

## 2013-05-27 ENCOUNTER — Inpatient Hospital Stay (HOSPITAL_COMMUNITY)
Admission: AD | Admit: 2013-05-27 | Discharge: 2013-05-27 | Disposition: A | Discharge status: Home / Self Care | Payer: Medicaid Other | Source: Ambulatory Visit | Attending: Obstetrics & Gynecology | Admitting: Obstetrics & Gynecology

## 2013-05-27 DIAGNOSIS — O209 Hemorrhage in early pregnancy, unspecified: Secondary | ICD-10-CM | POA: Insufficient documentation

## 2013-05-27 LAB — WET PREP, GENITAL
CLUE CELLS WET PREP: NONE SEEN
TRICH WET PREP: NONE SEEN
YEAST WET PREP: NONE SEEN

## 2013-05-27 LAB — URINALYSIS, ROUTINE W REFLEX MICROSCOPIC
BILIRUBIN URINE: NEGATIVE
Glucose, UA: NEGATIVE mg/dL
Ketones, ur: 15 mg/dL — AB
LEUKOCYTES UA: NEGATIVE
NITRITE: NEGATIVE
PH: 6 (ref 5.0–8.0)
Protein, ur: NEGATIVE mg/dL
SPECIFIC GRAVITY, URINE: 1.01 (ref 1.005–1.030)
UROBILINOGEN UA: 2 mg/dL — AB (ref 0.0–1.0)

## 2013-05-27 LAB — URINE MICROSCOPIC-ADD ON

## 2013-05-27 NOTE — MAU Note (Signed)
Pt presents to MAU with complaints of vaginal bleeding when she wiped at work. States there is no bleeding noted in her underwear. Last intercourse yesterday.

## 2013-05-27 NOTE — MAU Provider Note (Signed)
Attestation of Attending Supervision of Advanced Practitioner (PA/CNM/NP): Evaluation and management procedures were performed by the Advanced Practitioner under my supervision and collaboration.  I have reviewed the Advanced Practitioner's note and chart, and I agree with the management and plan.  Iverna Hammac, MD, FACOG Attending Obstetrician & Gynecologist Faculty Practice, [facility]  

## 2013-05-27 NOTE — MAU Provider Note (Signed)
Chief Complaint: Vaginal Bleeding and Back Pain   None    SUBJECTIVE HPI: Amber Mathews is a 28 y.o. [id number] at [id number] by LMP who presents to maternity admissions reporting vaginal spotting while at work today x1 episode.  She also noted a small spot of blood in her underwear on the way to the hospital but has not required a pad for her bleeding. She does report intercourse yesterday.  She denies vaginal itching/burning, urinary symptoms, h/a, dizziness, n/v, or fever/chills.    Past Medical History  Diagnosis Date  . Herpes genitalia     last outbreak 2013  . Ectopic pregnancy     Had MTX   Past Surgical History  Procedure Laterality Date  . No past surgeries     History   Social History  . Marital Status: Single    Spouse Name: N/A    Number of Children: N/A  . Years of Education: N/A   Occupational History  . Not on file.   Social History Main Topics  . Smoking status: Never Smoker   . Smokeless tobacco: Never Used  . Alcohol Use: No  . Drug Use: No  . Sexual Activity: Yes    Birth Control/ Protection: None     Comment: uses condoms somtimes. last depo 02/2012   Other Topics Concern  . Not on file   Social History Narrative  . No narrative on file   No current facility-administered medications on file prior to encounter.   No current outpatient prescriptions on file prior to encounter.   No Known Allergies  ROS: Pertinent items in HPI  OBJECTIVE Blood pressure 112/66, pulse 80, temperature 98.1 F (36.7 C), temperature source Oral, resp. rate 18, last menstrual period 02/18/2013. GENERAL: Well-developed, well-nourished female in no acute distress.  HEENT: Normocephalic HEART: normal rate RESP: normal effort ABDOMEN: Soft, non-tender EXTREMITIES: Nontender, no edema NEURO: Alert and oriented Pelvic exam: Cervix pink, visually closed, without lesion, small amount dark red bleeding from cervical os, vaginal walls and external genitalia normal Cervix  closed, subjectively shortened, balottable  FHT 147 by doppler  LAB RESULTS Results for orders placed during the hospital encounter of 05/27/13 (from the past 24 hour(s))  URINALYSIS, ROUTINE W REFLEX MICROSCOPIC     Status: Abnormal   Collection Time    05/27/13  8:32 AM      Result Value Ref Range   Color, Urine YELLOW  YELLOW   APPearance CLEAR  CLEAR   Specific Gravity, Urine 1.010  1.005 - 1.030   pH 6.0  5.0 - 8.0   Glucose, UA NEGATIVE  NEGATIVE mg/dL   Hgb urine dipstick LARGE (*) NEGATIVE   Bilirubin Urine NEGATIVE  NEGATIVE   Ketones, ur 15 (*) NEGATIVE mg/dL   Protein, ur NEGATIVE  NEGATIVE mg/dL   Urobilinogen, UA 2.0 (*) 0.0 - 1.0 mg/dL   Nitrite NEGATIVE  NEGATIVE   Leukocytes, UA NEGATIVE  NEGATIVE  URINE MICROSCOPIC-ADD ON     Status: None   Collection Time    05/27/13  8:32 AM      Result Value Ref Range   Squamous Epithelial / LPF RARE  RARE   WBC, UA 0-2  <3 WBC/hpf   RBC / HPF 7-10  <3 RBC/hpf   Bacteria, UA RARE  RARE  WET PREP, GENITAL     Status: Abnormal   Collection Time    05/27/13  9:00 AM      Result Value Ref Range   Yeast Wet  Prep HPF POC NONE SEEN  NONE SEEN   Trich, Wet Prep NONE SEEN  NONE SEEN   Clue Cells Wet Prep HPF POC NONE SEEN  NONE SEEN   WBC, Wet Prep HPF POC FEW (*) NONE SEEN    IMAGING Preliminary report with small SCH.  Per MFM by phone, [facility] unremarkable, unchanged from previous study. Cervical length wnl. Recommend f/u ultrasound in 4 weeks.    ASSESSMENT 1. Vaginal bleeding in pregnant patient at less than [redacted] weeks gestation     PLAN Discharge home with bleeding precautions F/U ultrasound scheduled in 4 weeks   Follow-up Information   Follow up with [facility] [place]. (As scheduled)    Contact information:   [address] [place] [address] [phone]      Follow up with [facility]TR[facility] In 4 weeks. (Ultrasound will call you to schedule repeat U/S in 4 weeks.)     Specialty:  Radiology   Contact information:   [address] [id number] [place] [address] [phone]      Sharen Counter Certified Nurse-Midwife 05/27/2013  2:08 PM

## 2013-05-27 NOTE — Discharge Instructions (Signed)
Vaginal Bleeding During Pregnancy, Second Trimester °A small amount of bleeding (spotting) from the vagina is relatively common in pregnancy. It usually stops on its own. Various things can cause bleeding or spotting in pregnancy. Some bleeding may be related to the pregnancy, and some may not. Sometimes the bleeding is normal and is not a problem. However, bleeding can also be a sign of something serious. Be sure to tell your health care provider about any vaginal bleeding right away. °Some possible causes of vaginal bleeding during the second trimester include: °· Infection, inflammation, or growths on the cervix.   °· The placenta may be partially or completely covering the opening of the cervix inside the uterus (placenta previa). °· The placenta may have separated from the uterus (abruption of the placenta).   °· You may be having early (preterm) labor.   °· The cervix may not be strong enough to keep a baby inside the uterus (cervical insufficiency).   °· Tiny cysts may have developed in the uterus instead of pregnancy tissue (molar pregnancy).  °HOME CARE INSTRUCTIONS  °Watch your condition for any changes. The following actions may help to lessen any discomfort you are feeling: °· Follow your health care provider's instructions for limiting your activity. If your health care provider orders bed rest, you may need to stay in bed and only get up to use the bathroom. However, your health care provider may allow you to continue light activity. °· If needed, make plans for someone to help with your regular activities and responsibilities while you are on bed rest. °· Keep track of the number of pads you use each day, how often you change pads, and how soaked (saturated) they are. Write this down. °· Do not use tampons. Do not douche. °· Do not have sexual intercourse or orgasms until approved by your health care provider. °· If you pass any tissue from your vagina, save the tissue so you can show it to your  health care provider. °· Only take over-the-counter or prescription medicines as directed by your health care provider. °· Do not take aspirin because it can make you bleed. °· Do not exercise or perform any strenuous activities or heavy lifting without your health care provider's permission. °· Keep all follow-up appointments as directed by your health care provider. °SEEK MEDICAL CARE IF: °· You have any vaginal bleeding during any part of your pregnancy. °· You have cramps or labor pains. °SEEK IMMEDIATE MEDICAL CARE IF:  °· You have severe cramps in your back or belly (abdomen). °· You have contractions. °· You have a fever, not controlled by medicine. °· You have chills. °· You pass large clots or tissue from your vagina. °· Your bleeding increases. °· You feel lightheaded or weak, or you have fainting episodes. °· You are leaking fluid or have a gush of fluid from your vagina. °MAKE SURE YOU: °· Understand these instructions. °· Will watch your condition. °· Will get help right away if you are not doing well or get worse. °Document Released: 10/01/2004 Document Revised: 10/12/2012 Document Reviewed: 08/29/2012 °ExitCare® Patient Information ©2014 [employer]. ° °

## 2013-05-29 LAB — GC/CHLAMYDIA PROBE AMP
CT Probe RNA: NEGATIVE
GC PROBE AMP APTIMA: NEGATIVE

## 2013-06-01 ENCOUNTER — Emergency Department (HOSPITAL_COMMUNITY): Payer: No Typology Code available for payment source

## 2013-06-01 ENCOUNTER — Emergency Department (HOSPITAL_COMMUNITY)
Admission: EM | Admit: 2013-06-01 | Discharge: 2013-06-01 | Disposition: A | Discharge status: Home / Self Care | Payer: No Typology Code available for payment source | Attending: Emergency Medicine | Admitting: Emergency Medicine

## 2013-06-01 ENCOUNTER — Encounter (HOSPITAL_COMMUNITY): Payer: Self-pay | Admitting: Emergency Medicine

## 2013-06-01 DIAGNOSIS — S3981XA Other specified injuries of abdomen, initial encounter: Secondary | ICD-10-CM | POA: Insufficient documentation

## 2013-06-01 DIAGNOSIS — Z79899 Other long term (current) drug therapy: Secondary | ICD-10-CM | POA: Insufficient documentation

## 2013-06-01 DIAGNOSIS — O9989 Other specified diseases and conditions complicating pregnancy, childbirth and the puerperium: Secondary | ICD-10-CM | POA: Insufficient documentation

## 2013-06-01 DIAGNOSIS — Z8619 Personal history of other infectious and parasitic diseases: Secondary | ICD-10-CM | POA: Insufficient documentation

## 2013-06-01 DIAGNOSIS — S139XXA Sprain of joints and ligaments of unspecified parts of neck, initial encounter: Secondary | ICD-10-CM | POA: Insufficient documentation

## 2013-06-01 DIAGNOSIS — S335XXA Sprain of ligaments of lumbar spine, initial encounter: Secondary | ICD-10-CM | POA: Insufficient documentation

## 2013-06-01 DIAGNOSIS — Y9389 Activity, other specified: Secondary | ICD-10-CM | POA: Insufficient documentation

## 2013-06-01 DIAGNOSIS — Y92410 Unspecified street and highway as the place of occurrence of the external cause: Secondary | ICD-10-CM | POA: Insufficient documentation

## 2013-06-01 DIAGNOSIS — S161XXA Strain of muscle, fascia and tendon at neck level, initial encounter: Secondary | ICD-10-CM

## 2013-06-01 DIAGNOSIS — S39012A Strain of muscle, fascia and tendon of lower back, initial encounter: Secondary | ICD-10-CM

## 2013-06-01 LAB — URINALYSIS, ROUTINE W REFLEX MICROSCOPIC
Bilirubin Urine: NEGATIVE
Glucose, UA: NEGATIVE mg/dL
Hgb urine dipstick: NEGATIVE
Ketones, ur: 15 mg/dL — AB
Leukocytes, UA: NEGATIVE
NITRITE: NEGATIVE
Protein, ur: NEGATIVE mg/dL
SPECIFIC GRAVITY, URINE: 1.01 (ref 1.005–1.030)
Urobilinogen, UA: 1 mg/dL (ref 0.0–1.0)
pH: 6.5 (ref 5.0–8.0)

## 2013-06-01 LAB — POC URINE PREG, ED: PREG TEST UR: POSITIVE — AB

## 2013-06-01 MED ORDER — SODIUM CHLORIDE 0.9 % IV BOLUS (SEPSIS)
1000.0000 mL | Freq: Once | INTRAVENOUS | Status: AC
  Administered 2013-06-01: 1000 mL via INTRAVENOUS

## 2013-06-01 MED ORDER — CYCLOBENZAPRINE HCL 10 MG PO TABS: 10.0000 mg | ORAL_TABLET | Freq: Two times a day (BID) | ORAL | Status: DC | PRN
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
## 2013-06-01 NOTE — Discharge Instructions (Signed)
Cervical Sprain °A cervical sprain is an injury in the neck in which the strong, fibrous tissues (ligaments) that connect your neck bones stretch or tear. Cervical sprains can range from mild to severe. Severe cervical sprains can cause the neck vertebrae to be unstable. This can lead to damage of the spinal cord and can result in serious nervous system problems. The amount of time it takes for a cervical sprain to get better depends on the cause and extent of the injury. Most cervical sprains heal in 1 to 3 weeks. °CAUSES  °Severe cervical sprains may be caused by:  °· Contact sport injuries (such as from football, rugby, wrestling, hockey, auto racing, gymnastics, diving, martial arts, or boxing).   °· Motor vehicle collisions.   °· Whiplash injuries. This is an injury from a sudden forward-and backward whipping movement of the head and neck.  °· Falls.   °Mild cervical sprains may be caused by:  °· Being in an awkward position, such as while cradling a telephone between your ear and shoulder.   °· Sitting in a chair that does not offer proper support.   °· Working at a poorly designed computer station.   °· Looking up or down for long periods of time.   °SYMPTOMS  °· Pain, soreness, stiffness, or a burning sensation in the front, back, or sides of the neck. This discomfort may develop immediately after the injury or slowly, 24 hours or more after the injury.   °· Pain or tenderness directly in the middle of the back of the neck.   °· Shoulder or upper back pain.   °· Limited ability to move the neck.   °· Headache.   °· Dizziness.   °· Weakness, numbness, or tingling in the hands or arms.   °· Muscle spasms.   °· Difficulty swallowing or chewing.   °· Tenderness and swelling of the neck.   °DIAGNOSIS  °Most of the time your health care provider can diagnose a cervical sprain by taking your history and doing a physical exam. Your health care provider will ask about previous neck injuries and any known neck  problems, such as arthritis in the neck. X-rays may be taken to find out if there are any other problems, such as with the bones of the neck. Other tests, such as a CT scan or MRI, may also be needed.  °TREATMENT  °Treatment depends on the severity of the cervical sprain. Mild sprains can be treated with rest, keeping the neck in place (immobilization), and pain medicines. Severe cervical sprains are immediately immobilized. Further treatment is done to help with pain, muscle spasms, and other symptoms and may include: °· Medicines, such as pain relievers, numbing medicines, or muscle relaxants.   °· Physical therapy. This may involve stretching exercises, strengthening exercises, and posture training. Exercises and improved posture can help stabilize the neck, strengthen muscles, and help stop symptoms from returning.   °HOME CARE INSTRUCTIONS  °· Put ice on the injured area.   °· Put ice in a plastic bag.   °· Place a towel between your skin and the bag.   °· Leave the ice on for 15 20 minutes, 3 4 times a day.   °· If your injury was severe, you may have been given a cervical collar to wear. A cervical collar is a two-piece collar designed to keep your neck from moving while it heals. °· Do not remove the collar unless instructed by your health care provider. °· If you have long hair, keep it outside of the collar. °· Ask your health care provider before making any adjustments to your collar.   Minor adjustments may be required over time to improve comfort and reduce pressure on your chin or on the back of your head.  Ifyou are allowed to remove the collar for cleaning or bathing, follow your health care provider's instructions on how to do so safely.  Keep your collar clean by wiping it with mild soap and water and drying it completely. If the collar you have been given includes removable pads, remove them every 1 2 days and hand wash them with soap and water. Allow them to air dry. They should be completely  dry before you wear them in the collar.  If you are allowed to remove the collar for cleaning and bathing, wash and dry the skin of your neck. Check your skin for irritation or sores. If you see any, tell your health care provider.  Do not drive while wearing the collar.   Only take over-the-counter or prescription medicines for pain, discomfort, or fever as directed by your health care provider.   Keep all follow-up appointments as directed by your health care provider.   Keep all physical therapy appointments as directed by your health care provider.   Make any needed adjustments to your workstation to promote good posture.   Avoid positions and activities that make your symptoms worse.   Warm up and stretch before being active to help prevent problems.  SEEK MEDICAL CARE IF:   Your pain is not controlled with medicine.   You are unable to decrease your pain medicine over time as planned.   Your activity level is not improving as expected.  SEEK IMMEDIATE MEDICAL CARE IF:   You develop any bleeding.  You develop stomach upset.  You have signs of an allergic reaction to your medicine.   Your symptoms get worse.   You develop new, unexplained symptoms.   You have numbness, tingling, weakness, or paralysis in any part of your body.  MAKE SURE YOU:   Understand these instructions.  Will watch your condition.  Will get help right away if you are not doing well or get worse. Document Released: 10/19/2006 Document Revised: 10/12/2012 Document Reviewed: 06/29/2012 [facility] Patient Information 2014 [place], [place].  Back Pain, Adult Low back pain is very common. About 1 in 5 people have back pain.The cause of low back pain is rarely dangerous. The pain often gets better over time.About half of people with a sudden onset of back pain feel better in just 2 weeks. About 8 in 10 people feel better by 6 weeks.  CAUSES Some common causes of back pain  include:  Strain of the muscles or ligaments supporting the spine.  Wear and tear (degeneration) of the spinal discs.  Arthritis.  Direct injury to the back. DIAGNOSIS Most of the time, the direct cause of low back pain is not known.However, back pain can be treated effectively even when the exact cause of the pain is unknown.Answering your caregiver's questions about your overall health and symptoms is one of the most accurate ways to make sure the cause of your pain is not dangerous. If your caregiver needs more information, he or she may order lab work or imaging tests (X-rays or MRIs).However, even if imaging tests show changes in your back, this usually does not require surgery. HOME CARE INSTRUCTIONS For many people, back pain returns.Since low back pain is rarely dangerous, it is often a condition that people can learn to [facility] their own.   Remain active. It is stressful on the back to  sit or stand in one place. Do not sit, drive, or stand in one place for more than 30 minutes at a time. Take short walks on level surfaces as soon as pain allows.Try to increase the length of time you walk each day.  Do not stay in bed.Resting more than 1 or 2 days can delay your recovery.  Do not avoid exercise or work.Your body is made to move.It is not dangerous to be active, even though your back may hurt.Your back will likely heal faster if you return to being active before your pain is gone.  Pay attention to your body when you bend and lift. Many people have less discomfortwhen lifting if they bend their knees, keep the load close to their bodies,and avoid twisting. Often, the most comfortable positions are those that put less stress on your recovering back.  Find a comfortable position to sleep. Use a firm mattress and lie on your side with your knees slightly bent. If you lie on your back, put a pillow under your knees.  Only take over-the-counter or prescription medicines as  directed by your caregiver. Over-the-counter medicines to reduce pain and inflammation are often the most helpful.Your caregiver may prescribe muscle relaxant drugs.These medicines help dull your pain so you can more quickly return to your normal activities and healthy exercise.  Put ice on the injured area.  Put ice in a plastic bag.  Place a towel between your skin and the bag.  Leave the ice on for 15-20 minutes, 03-04 times a day for the first 2 to 3 days. After that, ice and heat may be alternated to reduce pain and spasms.  Ask your caregiver about trying back exercises and gentle massage. This may be of some benefit.  Avoid feeling anxious or stressed.Stress increases muscle tension and can worsen back pain.It is important to recognize when you are anxious or stressed and learn ways to manage it.Exercise is a great option. SEEK MEDICAL CARE IF:  You have pain that is not relieved with rest or medicine.  You have pain that does not improve in 1 week.  You have new symptoms.  You are generally not feeling well. SEEK IMMEDIATE MEDICAL CARE IF:   You have pain that radiates from your back into your legs.  You develop new bowel or bladder control problems.  You have unusual weakness or numbness in your arms or legs.  You develop nausea or vomiting.  You develop abdominal pain.  You feel faint. Document Released: 12/22/2004 Document Revised: 06/23/2011 Document Reviewed: 05/12/2010 [facility] Patient Information 2014 [place], [place].
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
## 2013-06-05 ENCOUNTER — Other Ambulatory Visit (HOSPITAL_COMMUNITY): Payer: Self-pay | Admitting: Nurse Practitioner

## 2013-06-05 DIAGNOSIS — Z3689 Encounter for other specified antenatal screening: Secondary | ICD-10-CM

## 2013-06-05 LAB — OB RESULTS CONSOLE RUBELLA ANTIBODY, IGM: RUBELLA: IMMUNE

## 2013-06-05 LAB — OB RESULTS CONSOLE RPR: RPR: NONREACTIVE

## 2013-06-05 LAB — OB RESULTS CONSOLE HEPATITIS B SURFACE ANTIGEN: HEP B S AG: NEGATIVE

## 2013-06-30 ENCOUNTER — Encounter (HOSPITAL_COMMUNITY): Payer: Self-pay | Admitting: *Deleted

## 2013-06-30 ENCOUNTER — Inpatient Hospital Stay (HOSPITAL_COMMUNITY)
Admission: AD | Admit: 2013-06-30 | Discharge: 2013-06-30 | Disposition: A | Discharge status: Home / Self Care | Payer: Medicaid Other | Source: Ambulatory Visit | Attending: Obstetrics and Gynecology | Admitting: Obstetrics and Gynecology

## 2013-06-30 DIAGNOSIS — R109 Unspecified abdominal pain: Secondary | ICD-10-CM | POA: Insufficient documentation

## 2013-06-30 DIAGNOSIS — O99891 Other specified diseases and conditions complicating pregnancy: Secondary | ICD-10-CM | POA: Insufficient documentation

## 2013-06-30 DIAGNOSIS — O2342 Unspecified infection of urinary tract in pregnancy, second trimester: Secondary | ICD-10-CM

## 2013-06-30 DIAGNOSIS — O9989 Other specified diseases and conditions complicating pregnancy, childbirth and the puerperium: Secondary | ICD-10-CM

## 2013-06-30 DIAGNOSIS — M549 Dorsalgia, unspecified: Secondary | ICD-10-CM | POA: Insufficient documentation

## 2013-06-30 DIAGNOSIS — IMO0002 Reserved for concepts with insufficient information to code with codable children: Secondary | ICD-10-CM | POA: Insufficient documentation

## 2013-06-30 LAB — WET PREP, GENITAL
Clue Cells Wet Prep HPF POC: NONE SEEN
Trich, Wet Prep: NONE SEEN
YEAST WET PREP: NONE SEEN

## 2013-06-30 LAB — URINE MICROSCOPIC-ADD ON

## 2013-06-30 LAB — URINALYSIS, ROUTINE W REFLEX MICROSCOPIC
Bilirubin Urine: NEGATIVE
Glucose, UA: NEGATIVE mg/dL
Hgb urine dipstick: NEGATIVE
Ketones, ur: NEGATIVE mg/dL
Nitrite: NEGATIVE
PROTEIN: NEGATIVE mg/dL
Specific Gravity, Urine: <1.005 — ABNORMAL LOW (ref 1.005–1.030)
Urobilinogen, UA: 0.2 mg/dL (ref 0.0–1.0)
pH: 7 (ref 5.0–8.0)

## 2013-06-30 MED ORDER — CEPHALEXIN 500 MG PO CAPS: 500.0000 mg | ORAL_CAPSULE | Freq: Four times a day (QID) | ORAL | Status: DC

## 2013-06-30 MED ORDER — CYCLOBENZAPRINE HCL 10 MG PO TABS
10.0000 mg | ORAL_TABLET | ORAL | Status: AC
  Administered 2013-06-30: 10 mg via ORAL
  Filled 2013-06-30: qty 1

## 2013-06-30 NOTE — MAU Provider Note (Signed)
Attestation of Attending Supervision of Advanced Practitioner: Evaluation and management procedures were performed by the PA/NP/CNM/OB Fellow under my supervision/collaboration. Chart reviewed and agree with management and plan.  Hiroki Wint V 06/30/2013 10:48 PM

## 2013-06-30 NOTE — MAU Note (Signed)
abd cramping and cramping in back for last 3 days, has gotten worse.

## 2013-06-30 NOTE — Discharge Instructions (Signed)
Pregnancy and Urinary Tract Infection °A urinary tract infection (UTI) is a bacterial infection of the urinary tract. Infection of the urinary tract can include the ureters, kidneys (pyelonephritis), bladder (cystitis), and urethra (urethritis). All pregnant women should be screened for bacteria in the urinary tract. Identifying and treating a UTI will decrease the risk of preterm labor and developing more serious infections in both the mother and baby. °CAUSES °Bacteria germs cause almost all UTIs.  °RISK FACTORS °Many factors can increase your chances of getting a UTI during pregnancy. These include: °· Having a short urethra. °· Poor toilet and hygiene habits. °· Sexual intercourse. °· Blockage of urine along the urinary tract. °· Problems with the pelvic muscles or nerves. °· Diabetes. °· Obesity. °· Bladder problems after having several children. °· Previous history of UTI. °SIGNS AND SYMPTOMS  °· Pain, burning, or a stinging feeling when urinating. °· Suddenly feeling the need to urinate right away (urgency). °· Loss of bladder control (urinary incontinence). °· Frequent urination, more than is common with pregnancy. °· Lower abdominal or back discomfort. °· Cloudy urine. °· Blood in the urine (hematuria). °· Fever.  °When the kidneys are infected, the symptoms may be: °· Back pain. °· Flank pain on the right side more so than the left. °· Fever. °· Chills. °· Nausea. °· Vomiting. °DIAGNOSIS  °A urinary tract infection is usually diagnosed through urine tests. Additional tests and procedures are sometimes done. These may include: °· Ultrasound exam of the kidneys, ureters, bladder, and urethra. °· Looking in the bladder with a lighted tube (cystoscopy). °TREATMENT °Typically, UTIs can be treated with antibiotic medicines.  °HOME CARE INSTRUCTIONS  °· Only take over-the-counter or prescription medicines as directed by your health care provider. If you were prescribed antibiotics, take them as directed. Finish  them even if you start to feel better. °· Drink enough fluids to keep your urine clear or pale yellow. °· Do not have sexual intercourse until the infection is gone and your health care provider says it is okay. °· Make sure you are tested for UTIs throughout your pregnancy. These infections often come back.  °Preventing a UTI in the Future °· Practice good toilet habits. Always wipe from front to back. Use the tissue only once. °· Do not hold your urine. Empty your bladder as soon as possible when the urge comes. °· Do not douche or use deodorant sprays. °· Wash with soap and warm water around the genital area and the anus. °· Empty your bladder before and after sexual intercourse. °· Wear underwear with a cotton crotch. °· Avoid caffeine and carbonated drinks. They can irritate the bladder. °· Drink cranberry juice or take cranberry pills. This may decrease the risk of getting a UTI. °· Do not drink alcohol. °· Keep all your appointments and tests as scheduled.  °SEEK MEDICAL CARE IF:  °· Your symptoms get worse. °· You are still having fevers 2 or more days after treatment begins. °· You have a rash. °· You feel that you are having problems with medicines prescribed. °· You have abnormal vaginal discharge. °SEEK IMMEDIATE MEDICAL CARE IF:  °· You have back or flank pain. °· You have chills. °· You have blood in your urine. °· You have nausea and vomiting. °· You have contractions of your uterus. °· You have a gush of fluid from the vagina. °MAKE SURE YOU: °· Understand these instructions.   °· Will watch your condition.   °· Will get help right away if you are not doing   well or get worse.  Document Released: 04/18/2010 Document Revised: 10/12/2012 Document Reviewed: 07/21/2012 [facility] Patient Information 2015 [place], [place]. This information is not intended to replace advice given to you by your health care provider. Make sure you discuss any questions you have with your health care provider.  Back Pain in  Pregnancy Back pain during pregnancy is common. It happens in about half of all pregnancies. It is important for you and your baby that you remain active during your pregnancy.If you feel that back pain is not allowing you to remain active or sleep well, it is time to see your caregiver. Back pain may be caused by several factors related to changes during your pregnancy.Fortunately, unless you had trouble with your back before your pregnancy, the pain is likely to get better after you deliver. Low back pain usually occurs between the fifth and seventh months of pregnancy. It can, however, happen in the first couple months. Factors that increase the risk of back problems include:   Previous back problems.  Injury to your back.  Having twins or multiple births.  A chronic cough.  Stress.  Job-related repetitive motions.  Muscle or spinal disease in the back.  Family history of back problems, ruptured (herniated) discs, or osteoporosis.  Depression, anxiety, and panic attacks. CAUSES   When you are pregnant, your body produces a hormone called relaxin. This hormonemakes the ligaments connecting the low back and pubic bones more flexible. This flexibility allows the baby to be delivered more easily. When your ligaments are loose, your muscles need to work harder to support your back. Soreness in your back can come from tired muscles. Soreness can also come from back tissues that are irritated since they are receiving less support.  As the baby grows, it puts pressure on the nerves and blood vessels in your pelvis. This can cause back pain.  As the baby grows and gets heavier during pregnancy, the uterus pushes the stomach muscles forward and changes your center of gravity. This makes your back muscles work harder to maintain good posture. SYMPTOMS  Lumbar pain during pregnancy Lumbar pain during pregnancy usually occurs at or above the waist in the center of the back. There may be pain  and numbness that radiates into your leg or foot. This is similar to low back pain experienced by non-pregnant women. It usually increases with sitting for long periods of time, standing, or repetitive lifting. Tenderness may also be present in the muscles along your upper back. Posterior pelvic pain during pregnancy Pain in the back of the pelvis is more common than lumbar pain in pregnancy. It is a deep pain felt in your side at the waistline, or across the tailbone (sacrum), or in both places. You may have pain on one or both sides. This pain can also go into the buttocks and backs of the upper thighs. Pubic and groin pain may also be present. The pain does not quickly resolve with rest, and morning stiffness may also be present. Pelvic pain during pregnancy can be brought on by most activities. A high level of fitness before and during pregnancy may or may not prevent this problem. Labor pain is usually 1 to 2 minutes apart, lasts for about 1 minute, and involves a bearing down feeling or pressure in your pelvis. However, if you are at term with the pregnancy, constant low back pain can be the beginning of early labor, and you should be aware of this. DIAGNOSIS  X-rays of the  back should not be done during the first 12 to 14 weeks of the pregnancy and only when absolutely necessary during the rest of the pregnancy. MRIs do not give off radiation and are safe during pregnancy. MRIs also should only be done when absolutely necessary. HOME CARE INSTRUCTIONS  Exercise as directed by your caregiver. Exercise is the most effective way to prevent or manage back pain. If you have a back problem, it is especially important to avoid sports that require sudden body movements. Swimming and walking are great activities.  Do not stand in one place for long periods of time.  Do not wear high heels.  Sit in chairs with good posture. Use a pillow on your lower back if necessary. Make sure your head rests over your  shoulders and is not hanging forward.  Try sleeping on your side, preferably the left side, with a pillow or two between your legs. If you are sore after a night's rest, your bedmay betoo soft.Try placing a board between your mattress and box spring.  Listen to your body when lifting.If you are experiencing pain, ask for help or try bending yourknees more so you can use your leg muscles rather than your back muscles. Squat down when picking up something from the floor. Do not bend over.  Eat a healthy diet. Try to gain weight within your caregiver's recommendations.  Use heat or cold packs 3 to 4 times a day for 15 minutes to help with the pain.  Only take over-the-counter or prescription medicines for pain, discomfort, or fever as directed by your caregiver. Sudden (acute) back pain  Use bed rest for only the most extreme, acute episodes of back pain. Prolonged bed rest over 48 hours will aggravate your condition.  Ice is very effective for acute conditions.  Put ice in a plastic bag.  Place a towel between your skin and the bag.  Leave the ice on for 10 to 20 minutes every 2 hours, or as needed.  Using heat packs for 30 minutes prior to activities is also helpful. Continued back pain See your caregiver if you have continued problems. Your caregiver can help or refer you for appropriate physical therapy. With conditioning, most back problems can be avoided. Sometimes, a more serious issue may be the cause of back pain. You should be seen right away if new problems seem to be developing. Your caregiver may recommend:  A maternity girdle.  An elastic sling.  A back brace.  A massage therapist or acupuncture. SEEK MEDICAL CARE IF:   You are not able to do most of your daily activities, even when taking the pain medicine you were given.  You need a referral to a physical therapist or chiropractor.  You want to try acupuncture. SEEK IMMEDIATE MEDICAL CARE IF:  You develop  numbness, tingling, weakness, or problems with the use of your arms or legs.  You develop severe back pain that is no longer relieved with medicines.  You have a sudden change in bowel or bladder control.  You have increasing pain in other areas of the body.  You develop shortness of breath, dizziness, or fainting.  You develop nausea, vomiting, or sweating.  You have back pain which is similar to labor pains.  You have back pain along with your water breaking or vaginal bleeding.  You have back pain or numbness that travels down your leg.  Your back pain developed after you fell.  You develop pain on one side of  your back. You may have a kidney stone.  You see blood in your urine. You may have a bladder infection or kidney stone.  You have back pain with blisters. You may have shingles. Back pain is fairly common during pregnancy but should not be accepted as just part of the process. Back pain should always be treated as soon as possible. This will make your pregnancy as pleasant as possible. Document Released: 04/01/2005 Document Revised: 03/16/2011 Document Reviewed: 05/13/2010 [facility] Patient Information 2015 [place], [place]. This information is not intended to replace advice given to you by your health care provider. Make sure you discuss any questions you have with your health care provider.

## 2013-06-30 NOTE — MAU Provider Note (Signed)
Chief Complaint: Abdominal Cramping and Back Pain   First Provider Initiated Contact with Patient 06/30/13 1829     SUBJECTIVE HPI: Amber Mathews is a 28 y.o. G5P1031 at 3919w2d by LMP who presents to maternity admissions reporting abdominal cramping and back pain x3 days.  She reports the pain starts usually during the night and wakes her up. She has not taken anything for pain.  She also reports painful intercourse ~1 week ago, no intercourse since.  She reports feeling fetal movement, denies LOF, vaginal bleeding, vaginal itching/burning, urinary symptoms, h/a, dizziness, n/v, or fever/chills.    Past Medical History  Diagnosis Date  . Herpes genitalia     last outbreak 2013  . Ectopic pregnancy     Had MTX   Past Surgical History  Procedure Laterality Date  . No past surgeries     History   Social History  . Marital Status: Single    Spouse Name: N/A    Number of Children: N/A  . Years of Education: N/A   Occupational History  . Not on file.   Social History Main Topics  . Smoking status: Never Smoker   . Smokeless tobacco: Never Used  . Alcohol Use: No  . Drug Use: No  . Sexual Activity: Yes    Birth Control/ Protection: None     Comment: uses condoms somtimes. last depo 02/2012   Other Topics Concern  . Not on file   Social History Narrative  . No narrative on file   No current facility-administered medications on file prior to encounter.   Current Outpatient Prescriptions on File Prior to Encounter  Medication Sig Dispense Refill  . Prenatal Vit-Fe Fumarate-FA (PRENATAL MULTIVITAMIN) TABS tablet Take 1 tablet by mouth daily at 12 noon.       No Known Allergies  ROS: Pertinent items in HPI  OBJECTIVE Blood pressure 113/64, pulse 65, temperature 98.2 F (36.8 C), resp. rate 16, height 5' 4.5" (1.638 m), weight 78.926 kg (174 lb), last menstrual period 02/18/2013. GENERAL: Well-developed, well-nourished female in no acute distress.  HEENT:  Normocephalic HEART: normal rate RESP: normal effort ABDOMEN: Soft, non-tender EXTREMITIES: Nontender, no edema NEURO: Alert and oriented Pelvic exam: Cervix pink, visually closed, without lesion, small amount white creamy discharge, vaginal walls and external genitalia normal Bimanual exam: Cervix 0/long/high, firm, posterior, neg CMT  LAB RESULTS Results for orders placed during the hospital encounter of 06/30/13 (from the past 24 hour(s))  URINALYSIS, ROUTINE W REFLEX MICROSCOPIC     Status: Abnormal   Collection Time    06/30/13  5:50 PM      Result Value Ref Range   Color, Urine YELLOW  YELLOW   APPearance CLEAR  CLEAR   Specific Gravity, Urine <1.005 (*) 1.005 - 1.030   pH 7.0  5.0 - 8.0   Glucose, UA NEGATIVE  NEGATIVE mg/dL   Hgb urine dipstick NEGATIVE  NEGATIVE   Bilirubin Urine NEGATIVE  NEGATIVE   Ketones, ur NEGATIVE  NEGATIVE mg/dL   Protein, ur NEGATIVE  NEGATIVE mg/dL   Urobilinogen, UA 0.2  0.0 - 1.0 mg/dL   Nitrite NEGATIVE  NEGATIVE   Leukocytes, UA SMALL (*) NEGATIVE  URINE MICROSCOPIC-ADD ON     Status: Abnormal   Collection Time    06/30/13  5:50 PM      Result Value Ref Range   Squamous Epithelial / LPF FEW (*) RARE   WBC, UA 3-6  <3 WBC/hpf   Bacteria, UA FEW (*) RARE  WET PREP, GENITAL     Status: Abnormal   Collection Time    06/30/13  6:40 PM      Result Value Ref Range   Yeast Wet Prep HPF POC NONE SEEN  NONE SEEN   Trich, Wet Prep NONE SEEN  NONE SEEN   Clue Cells Wet Prep HPF POC NONE SEEN  NONE SEEN   WBC, Wet Prep HPF POC MODERATE (*) NONE SEEN    ASSESSMENT 1. Back pain affecting pregnancy in second trimester     PLAN Flexeril 10 mg PO x1 dose in MAU Discharge home Keflex QID x 7 days Urine sent for culture Pt to pick up Flexeril Rx from ED visit last month F/U next week as scheduled for U/S and prenatal visit    Medication List         prenatal multivitamin Tabs tablet  Take 1 tablet by mouth daily at 12 noon.        Follow-up Information   [facility] [place] (As scheduled)    Contact information:   [address] [place] [address] [phone]      [facility]. (As needed for emergencies)    Contact information:   [address] [id number] [place] [address] [phone]      Sharen CounterLisa Leftwich-Kirby Certified Nurse-Midwife 06/30/2013  7:37 PM

## 2013-07-01 LAB — GC/CHLAMYDIA PROBE AMP
CT Probe RNA: NEGATIVE
GC Probe RNA: NEGATIVE

## 2013-07-03 ENCOUNTER — Ambulatory Visit (HOSPITAL_COMMUNITY)
Admission: RE | Admit: 2013-07-03 | Discharge: 2013-07-03 | Disposition: A | Discharge status: Home / Self Care | Payer: Medicaid Other | Source: Ambulatory Visit | Attending: Nurse Practitioner | Admitting: Nurse Practitioner

## 2013-07-03 DIAGNOSIS — Z3689 Encounter for other specified antenatal screening: Secondary | ICD-10-CM | POA: Insufficient documentation

## 2013-07-06 ENCOUNTER — Other Ambulatory Visit (HOSPITAL_COMMUNITY): Payer: Self-pay | Admitting: Physician Assistant

## 2013-07-06 DIAGNOSIS — Z0489 Encounter for examination and observation for other specified reasons: Secondary | ICD-10-CM

## 2013-07-06 DIAGNOSIS — IMO0002 Reserved for concepts with insufficient information to code with codable children: Secondary | ICD-10-CM

## 2013-07-31 ENCOUNTER — Ambulatory Visit (HOSPITAL_COMMUNITY)
Admission: RE | Admit: 2013-07-31 | Discharge: 2013-07-31 | Disposition: A | Discharge status: Home / Self Care | Payer: Medicaid Other | Source: Ambulatory Visit | Attending: Physician Assistant | Admitting: Physician Assistant

## 2013-07-31 DIAGNOSIS — IMO0002 Reserved for concepts with insufficient information to code with codable children: Secondary | ICD-10-CM

## 2013-07-31 DIAGNOSIS — Z0489 Encounter for examination and observation for other specified reasons: Secondary | ICD-10-CM

## 2013-07-31 DIAGNOSIS — Z363 Encounter for antenatal screening for malformations: Secondary | ICD-10-CM | POA: Insufficient documentation

## 2013-07-31 DIAGNOSIS — Z1389 Encounter for screening for other disorder: Secondary | ICD-10-CM | POA: Insufficient documentation

## 2013-10-30 LAB — OB RESULTS CONSOLE GBS: STREP GROUP B AG: NEGATIVE

## 2013-10-30 LAB — OB RESULTS CONSOLE GC/CHLAMYDIA
Chlamydia: NEGATIVE
Gonorrhea: NEGATIVE

## 2013-11-06 ENCOUNTER — Encounter (HOSPITAL_COMMUNITY): Payer: Self-pay | Admitting: *Deleted

## 2013-11-27 ENCOUNTER — Other Ambulatory Visit (HOSPITAL_COMMUNITY): Payer: Self-pay | Admitting: Nurse Practitioner

## 2013-11-27 DIAGNOSIS — O480 Post-term pregnancy: Secondary | ICD-10-CM

## 2013-11-28 ENCOUNTER — Inpatient Hospital Stay (HOSPITAL_COMMUNITY)
Admission: AD | Admit: 2013-11-28 | Discharge: 2013-11-30 | DRG: 775 | Disposition: A | Discharge status: Home / Self Care | Payer: Medicaid Other | Source: Ambulatory Visit | Attending: Obstetrics and Gynecology | Admitting: Obstetrics and Gynecology

## 2013-11-28 ENCOUNTER — Inpatient Hospital Stay (HOSPITAL_COMMUNITY)
Admission: AD | Admit: 2013-11-28 | Discharge: 2013-11-28 | Disposition: A | Discharge status: Home / Self Care | Payer: Medicaid Other | Source: Ambulatory Visit | Attending: Family Medicine | Admitting: Family Medicine

## 2013-11-28 ENCOUNTER — Encounter (HOSPITAL_COMMUNITY): Payer: Self-pay | Admitting: General Practice

## 2013-11-28 ENCOUNTER — Encounter (HOSPITAL_COMMUNITY): Payer: Self-pay | Admitting: *Deleted

## 2013-11-28 DIAGNOSIS — O99820 Streptococcus B carrier state complicating pregnancy: Secondary | ICD-10-CM | POA: Insufficient documentation

## 2013-11-28 DIAGNOSIS — O99824 Streptococcus B carrier state complicating childbirth: Principal | ICD-10-CM | POA: Diagnosis present

## 2013-11-28 DIAGNOSIS — Z3A40 40 weeks gestation of pregnancy: Secondary | ICD-10-CM | POA: Diagnosis present

## 2013-11-28 DIAGNOSIS — IMO0001 Reserved for inherently not codable concepts without codable children: Secondary | ICD-10-CM

## 2013-11-28 DIAGNOSIS — O471 False labor at or after 37 completed weeks of gestation: Secondary | ICD-10-CM | POA: Diagnosis present

## 2013-11-28 LAB — TYPE AND SCREEN
ABO/RH(D): O POS
ANTIBODY SCREEN: NEGATIVE

## 2013-11-28 LAB — CBC
HCT: 43.8 % (ref 36.0–46.0)
HEMOGLOBIN: 15.3 g/dL — AB (ref 12.0–15.0)
MCH: 31.2 pg (ref 26.0–34.0)
MCHC: 34.9 g/dL (ref 30.0–36.0)
MCV: 89.4 fL (ref 78.0–100.0)
Platelets: 210 10*3/uL (ref 150–400)
RBC: 4.9 MIL/uL (ref 3.87–5.11)
RDW: 14.2 % (ref 11.5–15.5)
WBC: 11.7 10*3/uL — ABNORMAL HIGH (ref 4.0–10.5)

## 2013-11-28 LAB — RPR

## 2013-11-28 MED ORDER — ONDANSETRON HCL 4 MG/2ML IJ SOLN
4.0000 mg | Freq: Four times a day (QID) | INTRAMUSCULAR | Status: DC | PRN
  Filled 2013-11-28: qty 2

## 2013-11-28 MED ORDER — SODIUM CHLORIDE 0.9 % IV SOLN: 250.0000 mL | INTRAVENOUS | Status: DC | PRN

## 2013-11-28 MED ORDER — PRENATAL MULTIVITAMIN CH
1.0000 | ORAL_TABLET | Freq: Every day | ORAL | Status: DC
  Administered 2013-11-29: 1 via ORAL
  Filled 2013-11-28: qty 1

## 2013-11-28 MED ORDER — OXYTOCIN BOLUS FROM INFUSION
500.0000 mL | INTRAVENOUS | Status: DC
  Administered 2013-11-28: 500 mL via INTRAVENOUS

## 2013-11-28 MED ORDER — CITRIC ACID-SODIUM CITRATE 334-500 MG/5ML PO SOLN: 30.0000 mL | ORAL | Status: DC | PRN

## 2013-11-28 MED ORDER — SODIUM CHLORIDE 0.9 % IJ SOLN: 3.0000 mL | Freq: Two times a day (BID) | INTRAMUSCULAR | Status: DC

## 2013-11-28 MED ORDER — OXYCODONE-ACETAMINOPHEN 5-325 MG PO TABS: 2.0000 | ORAL_TABLET | ORAL | Status: DC | PRN

## 2013-11-28 MED ORDER — OXYCODONE-ACETAMINOPHEN 5-325 MG PO TABS: 1.0000 | ORAL_TABLET | ORAL | Status: DC | PRN

## 2013-11-28 MED ORDER — DIPHENHYDRAMINE HCL 25 MG PO CAPS: 25.0000 mg | ORAL_CAPSULE | Freq: Four times a day (QID) | ORAL | Status: DC | PRN

## 2013-11-28 MED ORDER — LANOLIN HYDROUS EX OINT: TOPICAL_OINTMENT | CUTANEOUS | Status: DC | PRN

## 2013-11-28 MED ORDER — OXYTOCIN 40 UNITS IN LACTATED RINGERS INFUSION - SIMPLE MED: 62.5000 mL/h | INTRAVENOUS | Status: DC | PRN

## 2013-11-28 MED ORDER — OXYTOCIN 40 UNITS IN LACTATED RINGERS INFUSION - SIMPLE MED
62.5000 mL/h | INTRAVENOUS | Status: DC
  Filled 2013-11-28: qty 1000

## 2013-11-28 MED ORDER — HYDROCODONE-ACETAMINOPHEN 5-325 MG PO TABS
1.0000 | ORAL_TABLET | Freq: Once | ORAL | Status: AC
  Administered 2013-11-28: 1 via ORAL
  Filled 2013-11-28: qty 1

## 2013-11-28 MED ORDER — FLEET ENEMA 7-19 GM/118ML RE ENEM: 1.0000 | ENEMA | Freq: Every day | RECTAL | Status: DC | PRN

## 2013-11-28 MED ORDER — BENZOCAINE-MENTHOL 20-0.5 % EX AERO
1.0000 "application " | INHALATION_SPRAY | CUTANEOUS | Status: DC | PRN
  Filled 2013-11-28: qty 56

## 2013-11-28 MED ORDER — SODIUM CHLORIDE 0.9 % IJ SOLN: 3.0000 mL | INTRAMUSCULAR | Status: DC | PRN

## 2013-11-28 MED ORDER — LIDOCAINE HCL (PF) 1 % IJ SOLN
30.0000 mL | INTRAMUSCULAR | Status: DC | PRN
  Filled 2013-11-28: qty 30

## 2013-11-28 MED ORDER — ONDANSETRON HCL 4 MG/2ML IJ SOLN: 4.0000 mg | INTRAMUSCULAR | Status: DC | PRN

## 2013-11-28 MED ORDER — BISACODYL 10 MG RE SUPP: 10.0000 mg | Freq: Every day | RECTAL | Status: DC | PRN

## 2013-11-28 MED ORDER — MISOPROSTOL 200 MCG PO TABS
ORAL_TABLET | ORAL | Status: AC
  Filled 2013-11-28: qty 5

## 2013-11-28 MED ORDER — FENTANYL CITRATE 0.05 MG/ML IJ SOLN
100.0000 ug | INTRAMUSCULAR | Status: DC | PRN
  Filled 2013-11-28: qty 2

## 2013-11-28 MED ORDER — SIMETHICONE 80 MG PO CHEW: 80.0000 mg | CHEWABLE_TABLET | ORAL | Status: DC | PRN

## 2013-11-28 MED ORDER — ZOLPIDEM TARTRATE 5 MG PO TABS: 5.0000 mg | ORAL_TABLET | Freq: Every evening | ORAL | Status: DC | PRN

## 2013-11-28 MED ORDER — ONDANSETRON HCL 4 MG PO TABS
4.0000 mg | ORAL_TABLET | ORAL | Status: DC | PRN
Start: 2013-11-28 — End: 2013-11-30

## 2013-11-28 MED ORDER — FLEET ENEMA 7-19 GM/118ML RE ENEM
1.0000 | ENEMA | RECTAL | Status: DC | PRN
Start: 2013-11-28 — End: 2013-11-28

## 2013-11-28 MED ORDER — ACETAMINOPHEN 325 MG PO TABS: 650.0000 mg | ORAL_TABLET | ORAL | Status: DC | PRN

## 2013-11-28 MED ORDER — LACTATED RINGERS IV SOLN: 500.0000 mL | INTRAVENOUS | Status: DC | PRN

## 2013-11-28 MED ORDER — MISOPROSTOL 200 MCG PO TABS
1000.0000 ug | ORAL_TABLET | Freq: Once | ORAL | Status: AC
  Administered 2013-11-28: 1000 ug via ORAL

## 2013-11-28 MED ORDER — WITCH HAZEL-GLYCERIN EX PADS: 1.0000 "application " | MEDICATED_PAD | CUTANEOUS | Status: DC | PRN

## 2013-11-28 MED ORDER — ACETAMINOPHEN 500 MG PO TABS
1000.0000 mg | ORAL_TABLET | Freq: Four times a day (QID) | ORAL | Status: DC | PRN
  Administered 2013-11-28: 1000 mg via ORAL
  Filled 2013-11-28: qty 2

## 2013-11-28 MED ORDER — LACTATED RINGERS IV SOLN
INTRAVENOUS | Status: DC
  Administered 2013-11-28: 13:00:00 via INTRAVENOUS

## 2013-11-28 MED ORDER — DIBUCAINE 1 % RE OINT: 1.0000 "application " | TOPICAL_OINTMENT | RECTAL | Status: DC | PRN

## 2013-11-28 MED ORDER — IBUPROFEN 600 MG PO TABS
600.0000 mg | ORAL_TABLET | Freq: Four times a day (QID) | ORAL | Status: DC
  Administered 2013-11-28 – 2013-11-30 (×7): 600 mg via ORAL
  Filled 2013-11-28 (×8): qty 1

## 2013-11-28 MED ORDER — SENNOSIDES-DOCUSATE SODIUM 8.6-50 MG PO TABS
2.0000 | ORAL_TABLET | ORAL | Status: DC
  Administered 2013-11-29 – 2013-11-30 (×2): 2 via ORAL
  Filled 2013-11-28 (×2): qty 2

## 2013-11-28 NOTE — Progress Notes (Signed)
Pt to walk for 1-2 hours then return for recheck

## 2013-11-28 NOTE — Plan of Care (Signed)
Problem: Discharge Progression Outcomes Goal: Prior to transfer discontinue epidural Outcome: Not Applicable Date Met:  19/15/50

## 2013-11-28 NOTE — Progress Notes (Signed)
UR chart review completed.  

## 2013-11-28 NOTE — MAU Note (Signed)
Pt reports uc's q 6-7 min since 0100

## 2013-11-28 NOTE — Lactation Note (Signed)
This note was copied from the chart of Amber Mathews. Lactation Consultation Note  P2, First time breastfeeding. Baby getting ready to be bathed. Reviewed hand expression.  Glistening of breastmilk expressed. Reviewed breastfeeding basics, cluster feeding, depth and feeding cues. Mother has WIC and [facility]. Mom encouraged to feed baby 8-12 times/24 hours and with feeding cues.  Mom made aware of O/P services, breastfeeding support groups, community resources, and our phone # for post-discharge questions.    Patient Name: Amber Doran StablerSauncerae Ritchie RUEAV'WToday's Date: 11/28/2013 Reason for consult: Initial assessment   Maternal Data Has patient been taught Hand Expression?: Yes Does the patient have breastfeeding experience prior to this delivery?: No  Feeding    LATCH Score/Interventions                      Lactation Tools Discussed/Used     Consult Status Consult Status: Follow-up Date: 11/29/13 Follow-up type: In-patient    Dahlia ByesBerkelhammer, [facility] 11/28/2013, 8:57 PM

## 2013-11-28 NOTE — MAU Note (Signed)
PT  SAYS SHE  STARTED HURTING BAD AT 12 MN.   VE  3 CM  TODAY.   HAS HX  HSV-  LAST OUTBREAK-   2014-  TAKES VALTREX.     DENIES MRSA.     GBS- POSITIVE.

## 2013-11-28 NOTE — Plan of Care (Signed)
Problem: Phase I Progression Outcomes Goal: Voiding adequately Outcome: Completed/Met Date Met:  11/28/13

## 2013-11-28 NOTE — Discharge Instructions (Signed)
Fetal Movement Counts °Patient Name: __________________________________________________ Patient Due Date: ____________________ °Performing a fetal movement count is highly recommended in high-risk pregnancies, but it is good for every pregnant woman to do. Your health care provider may ask you to start counting fetal movements at 28 weeks of the pregnancy. Fetal movements often increase: °· After eating a full meal. °· After physical activity. °· After eating or drinking something sweet or cold. °· At rest. °Pay attention to when you feel the baby is most active. This will help you notice a pattern of your baby's sleep and wake cycles and what factors contribute to an increase in fetal movement. It is important to perform a fetal movement count at the same time each day when your baby is normally most active.  °HOW TO COUNT FETAL MOVEMENTS °1. Find a quiet and comfortable area to sit or lie down on your left side. Lying on your left side provides the best blood and oxygen circulation to your baby. °2. Write down the day and time on a sheet of paper or in a journal. °3. Start counting kicks, flutters, swishes, rolls, or jabs in a 2-hour period. You should feel at least 10 movements within 2 hours. °4. If you do not feel 10 movements in 2 hours, wait 2-3 hours and count again. Look for a change in the pattern or not enough counts in 2 hours. °SEEK MEDICAL CARE IF: °· You feel less than 10 counts in 2 hours, tried twice. °· There is no movement in over an hour. °· The pattern is changing or taking longer each day to reach 10 counts in 2 hours. °· You feel the baby is not moving as he or she usually does. °Date: ____________ Movements: ____________ Start time: ____________ Finish time: ____________  °Date: ____________ Movements: ____________ Start time: ____________ Finish time: ____________ °Date: ____________ Movements: ____________ Start time: ____________ Finish time: ____________ °Date: ____________ Movements:  ____________ Start time: ____________ Finish time: ____________ °Date: ____________ Movements: ____________ Start time: ____________ Finish time: ____________ °Date: ____________ Movements: ____________ Start time: ____________ Finish time: ____________ °Date: ____________ Movements: ____________ Start time: ____________ Finish time: ____________ °Date: ____________ Movements: ____________ Start time: ____________ Finish time: ____________  °Date: ____________ Movements: ____________ Start time: ____________ Finish time: ____________ °Date: ____________ Movements: ____________ Start time: ____________ Finish time: ____________ °Date: ____________ Movements: ____________ Start time: ____________ Finish time: ____________ °Date: ____________ Movements: ____________ Start time: ____________ Finish time: ____________ °Date: ____________ Movements: ____________ Start time: ____________ Finish time: ____________ °Date: ____________ Movements: ____________ Start time: ____________ Finish time: ____________ °Date: ____________ Movements: ____________ Start time: ____________ Finish time: ____________  °Date: ____________ Movements: ____________ Start time: ____________ Finish time: ____________ °Date: ____________ Movements: ____________ Start time: ____________ Finish time: ____________ °Date: ____________ Movements: ____________ Start time: ____________ Finish time: ____________ °Date: ____________ Movements: ____________ Start time: ____________ Finish time: ____________ °Date: ____________ Movements: ____________ Start time: ____________ Finish time: ____________ °Date: ____________ Movements: ____________ Start time: ____________ Finish time: ____________ °Date: ____________ Movements: ____________ Start time: ____________ Finish time: ____________  °Date: ____________ Movements: ____________ Start time: ____________ Finish time: ____________ °Date: ____________ Movements: ____________ Start time: ____________ Finish  time: ____________ °Date: ____________ Movements: ____________ Start time: ____________ Finish time: ____________ °Date: ____________ Movements: ____________ Start time: ____________ Finish time: ____________ °Date: ____________ Movements: ____________ Start time: ____________ Finish time: ____________ °Date: ____________ Movements: ____________ Start time: ____________ Finish time: ____________ °Date: ____________ Movements: ____________ Start time: ____________ Finish time: ____________  °Date: ____________ Movements: ____________ Start time: ____________ Finish   time: ____________ °Date: ____________ Movements: ____________ Start time: ____________ Finish time: ____________ °Date: ____________ Movements: ____________ Start time: ____________ Finish time: ____________ °Date: ____________ Movements: ____________ Start time: ____________ Finish time: ____________ °Date: ____________ Movements: ____________ Start time: ____________ Finish time: ____________ °Date: ____________ Movements: ____________ Start time: ____________ Finish time: ____________ °Date: ____________ Movements: ____________ Start time: ____________ Finish time: ____________  °Date: ____________ Movements: ____________ Start time: ____________ Finish time: ____________ °Date: ____________ Movements: ____________ Start time: ____________ Finish time: ____________ °Date: ____________ Movements: ____________ Start time: ____________ Finish time: ____________ °Date: ____________ Movements: ____________ Start time: ____________ Finish time: ____________ °Date: ____________ Movements: ____________ Start time: ____________ Finish time: ____________ °Date: ____________ Movements: ____________ Start time: ____________ Finish time: ____________ °Date: ____________ Movements: ____________ Start time: ____________ Finish time: ____________  °Date: ____________ Movements: ____________ Start time: ____________ Finish time: ____________ °Date: ____________  Movements: ____________ Start time: ____________ Finish time: ____________ °Date: ____________ Movements: ____________ Start time: ____________ Finish time: ____________ °Date: ____________ Movements: ____________ Start time: ____________ Finish time: ____________ °Date: ____________ Movements: ____________ Start time: ____________ Finish time: ____________ °Date: ____________ Movements: ____________ Start time: ____________ Finish time: ____________ °Date: ____________ Movements: ____________ Start time: ____________ Finish time: ____________  °Date: ____________ Movements: ____________ Start time: ____________ Finish time: ____________ °Date: ____________ Movements: ____________ Start time: ____________ Finish time: ____________ °Date: ____________ Movements: ____________ Start time: ____________ Finish time: ____________ °Date: ____________ Movements: ____________ Start time: ____________ Finish time: ____________ °Date: ____________ Movements: ____________ Start time: ____________ Finish time: ____________ °Date: ____________ Movements: ____________ Start time: ____________ Finish time: ____________ °Document Released: 01/21/2006 Document Revised: 05/08/2013 Document Reviewed: 10/19/2011 °[employer]® Patient Information ©2015 [employer]. This information is not intended to replace advice given to you by your health care provider. Make sure you discuss any questions you have with your health care provider. °Braxton Hicks Contractions °Contractions of the uterus can occur throughout pregnancy. Contractions are not always a sign that you are in labor.  °WHAT ARE BRAXTON HICKS CONTRACTIONS?  °Contractions that occur before labor are called Braxton Hicks contractions, or false labor. Toward the end of pregnancy (32-34 weeks), these contractions can develop more often and may become more forceful. This is not true labor because these contractions do not result in opening (dilatation) and thinning of the cervix. They  are sometimes difficult to tell apart from true labor because these contractions can be forceful and people have different pain tolerances. You should not feel embarrassed if you go to the hospital with false labor. Sometimes, the only way to tell if you are in true labor is for your health care provider to look for changes in the cervix. °If there are no prenatal problems or other health problems associated with the pregnancy, it is completely safe to be sent home with false labor and await the onset of true labor. °HOW CAN YOU TELL THE DIFFERENCE BETWEEN TRUE AND FALSE LABOR? °False Labor °· The contractions of false labor are usually shorter and not as hard as those of true labor.   °· The contractions are usually irregular.   °· The contractions are often felt in the front of the lower abdomen and in the groin.   °· The contractions may go away when you walk around or change positions while lying down.   °· The contractions get weaker and are shorter lasting as time goes on.   °· The contractions do not usually become progressively stronger, regular, and closer together as with true labor.   °True Labor °5. Contractions in true labor last 30-70 seconds, become   very regular, usually become more intense, and increase in frequency.   °6. The contractions do not go away with walking.   °7. The discomfort is usually felt in the top of the uterus and spreads to the lower abdomen and low back.   °8. True labor can be determined by your health care provider with an exam. This will show that the cervix is dilating and getting thinner.   °WHAT TO REMEMBER °· Keep up with your usual exercises and follow other instructions given by your health care provider.   °· Take medicines as directed by your health care provider.   °· Keep your regular prenatal appointments.   °· Eat and drink lightly if you think you are going into labor.   °· If Braxton Hicks contractions are making you uncomfortable:   °· Change your position from  lying down or resting to walking, or from walking to resting.   °· Sit and rest in a tub of warm water.   °· Drink 2-3 glasses of water. Dehydration may cause these contractions.   °· Do slow and deep breathing several times an hour.   °WHEN SHOULD I SEEK IMMEDIATE MEDICAL CARE? °Seek immediate medical care if: °· Your contractions become stronger, more regular, and closer together.   °· You have fluid leaking or gushing from your vagina.   °· You have a fever.   °· You pass blood-tinged mucus.   °· You have vaginal bleeding.   °· You have continuous abdominal pain.   °· You have low back pain that you never had before.   °· You feel your baby's head pushing down and causing pelvic pressure.   °· Your baby is not moving as much as it used to.   °Document Released: 12/22/2004 Document Revised: 12/27/2012 Document Reviewed: 10/03/2012 °Exit[employer]® Patient Information ©2015 [employer]. This information is not intended to replace advice given to you by your health care provider. Make sure you discuss any questions you have with your health care provider. ° °

## 2013-11-28 NOTE — H&P (Signed)
Amber Mathews is a 28 y.o. female presenting for labor.  Patient presented to [facility]U for contractions occurring q644min since awakening this AM.  Was noted to be 5/100/-1 on cervical exam.  Maternal Medical History:  Reason for admission: Nausea.    OB History    Gravida Para Term Preterm AB TAB SAB Ectopic Multiple Living   5 2 2  3 2  1  0 2     Past Medical History  Diagnosis Date  . Herpes genitalia     last outbreak 2013  . Ectopic pregnancy     Had MTX   Past Surgical History  Procedure Laterality Date  . No past surgeries     Family History: family history includes Asthma in her daughter; Cancer in her maternal grandfather; Heart disease in her paternal grandfather. Social History:  reports that she has never smoked. She has never used smokeless tobacco. She reports that she does not drink alcohol or use illicit drugs.   Review of Systems  Constitutional: Negative for fever, chills and weight loss.  Respiratory: Negative for shortness of breath.   Cardiovascular: Negative for chest pain.  Gastrointestinal: Negative for nausea and vomiting.  Neurological: Negative for headaches.     Cervical exam: 5/100/-1 on admission Blood pressure 110/70, pulse 70, temperature 100.2 F (37.9 C), temperature source Oral, resp. rate 18, height 5\' 4"  (1.626 m), weight 83.462 kg (184 lb), last menstrual period 02/18/2013, SpO2 98 %, unknown if currently breastfeeding. Maternal Exam:  Uterine Assessment: Contraction strength is moderate.  Contraction frequency is regular.   Introitus: Ferning test: not done.  Nitrazine test: not done. Amniotic fluid character: not assessed.  Pelvis: adequate for delivery.      Fetal Exam Fetal Monitor Review: Baseline rate: 130.  Variability: moderate (6-25 bpm).   Pattern: accelerations present and no decelerations.    Fetal State Assessment: Category I - tracings are normal.     Physical Exam  Constitutional: She is oriented to  person, place, and time. She appears well-developed and well-nourished. No distress.  HENT:  Head: Normocephalic and atraumatic.  Cardiovascular: Normal rate.   Respiratory: Effort normal. No respiratory distress.  Musculoskeletal: She exhibits no edema or tenderness.  Neurological: She is alert and oriented to person, place, and time.    Prenatal labs: ABO, Rh: --/--/O POS (11/24 1537) Antibody: NEG (11/24 1537) Rubella: Immune (06/01 0000) RPR: Nonreactive (06/01 0000)  HBsAg: Negative (06/01 0000)  HIV: NON REACTIVE (04/01 1044)  GBS: Negative (10/26 0000)   Assessment/Plan: Amber Mathews is a 28 y.o. [id number] at 1786w6d here for active labor  #Labor: expectant management #Pain: Epidural if desired by patient, IV Fentanyl #FWB: Cat 1, reactive, reassuring #ID:  GBS positive - note patient did not receive abx prior to delivery #MOF: unknown #MOC:unknown #Circ:  unknown   Shirlee LatchBacigalupo, Angela 11/28/2013, 5:58 PM  OB fellow attestation:  I have seen and examined this patient; I agree with above documentation in the resident's note.   Amber Mathews is a 28 y.o. [phone] here in active labor  PE: BP 111/62 mmHg  Pulse 70  Temp(Src) 98.3 F (36.8 C) (Oral)  Resp 16  Ht 5\' 4"  (1.626 m)  Wt 184 lb (83.462 kg)  BMI 31.57 kg/m2  SpO2 98%  LMP 02/18/2013  Breastfeeding? Unknown Gen: calm comfortable, NAD Resp: normal effort, no distress Abd: gravid  ROS, labs, PMH reviewed  Plan: - admit to L&D for active labor  Perry MountACOSTA,Carvel Huskins ROCIO, MD 4:06 PM

## 2013-11-28 NOTE — MAU Provider Note (Signed)
Patient admitted for management of active labor.  Please see H&P for more details.   Shirlee LatchAngela Karesha Trzcinski, MD, MPH PGY-1,  [facility] 11/28/2013 1:20 PM

## 2013-11-28 NOTE — MAU Note (Signed)
Patient states she is having contractions every 7-8 minutes. Has a little spotting, no leaking. Reports good fetal movement.

## 2013-11-28 NOTE — Plan of Care (Signed)
Problem: Phase I Progression Outcomes Goal: Pain controlled with appropriate interventions Outcome: Completed/Met Date Met:  11/28/13     

## 2013-11-29 NOTE — Lactation Note (Signed)
This note was copied from the chart of Amber Lannie Shoaff. Lactation Consultation Note  Follow up visit made.  Mom states feedings are going well.  Denies questions/concerns.  Encouraged to call with questions/concerns prn.  Patient Name: Amber Mathews OZHYQ'MToday'Mathews Date: 11/29/2013     Maternal Data    Feeding Feeding Type: Breast Fed Length of feed: 15 min (per Mom; [facility])  LATCH Score/Interventions                      Lactation Tools Discussed/Used     Consult Status      [name], Amber Mathews 11/29/2013, 3:52 PM

## 2013-11-29 NOTE — Plan of Care (Signed)
Problem: Phase II Progression Outcomes Goal: Pain controlled on oral analgesia Outcome: Completed/Met Date Met:  11/29/13 Goal: Progress activity as tolerated unless otherwise ordered Outcome: Completed/Met Date Met:  11/29/13 Goal: Afebrile, VS remain stable Outcome: Completed/Met Date Met:  11/29/13 Goal: Incision intact & without signs/symptoms of infection Outcome: Not Applicable Date Met:  54/65/03 Goal: Rh isoimmunization per orders Outcome: Completed/Met Date Met:  11/29/13 Goal: Tolerating diet Outcome: Completed/Met Date Met:  11/29/13 Goal: Other Phase II Outcomes/Goals Outcome: Completed/Met Date Met:  11/29/13  Problem: Discharge Progression Outcomes Goal: Remove staples per MD order Outcome: Not Applicable Date Met:  54/65/68 Goal: MMR given as ordered Outcome: Not Applicable Date Met:  12/75/17

## 2013-11-29 NOTE — Plan of Care (Signed)
Problem: Consults Goal: Postpartum Patient Education (See Patient Education module for education specifics.)  Outcome: Completed/Met Date Met:  11/29/13 Goal: Skin Care Protocol Initiated - if Braden Score 18 or less If consults are not indicated, leave blank or document N/A  Outcome: Not Applicable Date Met:  33/53/31 Goal: Nutrition Consult-if indicated Outcome: Not Applicable Date Met:  74/09/92 Goal: Diabetes Guidelines if Diabetic/Glucose > 140 If diabetic or lab glucose is > 140 mg/dl - Initiate Diabetes/Hyperglycemia Guidelines & Document Interventions  Outcome: Not Applicable Date Met:  78/00/44  Problem: Phase I Progression Outcomes Goal: Pain controlled with appropriate interventions Outcome: Completed/Met Date Met:  11/29/13 Goal: Foley catheter patent Outcome: Not Applicable Date Met:  71/58/06 Goal: OOB as tolerated unless otherwise ordered Outcome: Completed/Met Date Met:  11/29/13 Goal: IS, TCDB as ordered Outcome: Not Applicable Date Met:  38/68/54 Goal: VS, stable, temp < 100.4 degrees F Outcome: Completed/Met Date Met:  11/29/13 Goal: Initial discharge plan identified Outcome: Completed/Met Date Met:  11/29/13 Goal: Other Phase I Outcomes/Goals Outcome: Not Applicable Date Met:  88/30/14

## 2013-11-29 NOTE — Plan of Care (Signed)
Problem: Discharge Progression Outcomes Goal: Activity appropriate for discharge plan Outcome: Completed/Met Date Met:  11/29/13 Goal: Tolerating diet Outcome: Completed/Met Date Met:  11/29/13 Goal: Pain controlled with appropriate interventions Outcome: Completed/Met Date Met:  11/29/13

## 2013-11-29 NOTE — Progress Notes (Signed)
Post Partum Day 1 Subjective: no complaints, up ad lib, voiding and tolerating PO  Objective: Blood pressure 111/62, pulse 70, temperature 98.3 F (36.8 C), temperature source Oral, resp. rate 16, height 5\' 4"  (1.626 m), weight 83.462 kg (184 lb), last menstrual period 02/18/2013, SpO2 98 %, unknown if currently breastfeeding.  Physical Exam:  General: alert, cooperative and no distress Lochia: appropriate Uterine Fundus: firm DVT Evaluation: No evidence of DVT seen on physical exam.   Recent Labs  11/28/13 1235  HGB 15.3*  HCT 43.8    Assessment/Plan: Plan for discharge tomorrow and Breastfeeding   LOS: 1 day   Shirlee LatchBacigalupo, Angela 11/29/2013, 7:53 AM   I have seen and examined this patient and I agree with the above. Cam HaiSHAW, Quentina Fronek CNM 8:02 AM 11/29/2013

## 2013-11-30 DIAGNOSIS — IMO0001 Reserved for inherently not codable concepts without codable children: Secondary | ICD-10-CM

## 2013-11-30 NOTE — Discharge Summary (Signed)
Obstetric Discharge Summary Reason for Admission: onset of labor Prenatal Procedures: NST Intrapartum Procedures: spontaneous vaginal delivery Postpartum Procedures: none Complications-Operative and Postpartum: Abrasions degree perineal laceration HEMOGLOBIN  Date Value Ref Range Status  11/28/2013 15.3* 12.0 - 15.0 g/dL Final   HCT  Date Value Ref Range Status  11/28/2013 43.8 36.0 - 46.0 % Final  Hospital Course:  Expand All Collapse All   Amber Mathews is a 28 y.o. female presenting for labor. Patient presented to [facility]U for contractions occurring q404min since awakening this AM. Was noted to be 5/100/-1 on cervical exam.       Expand All Collapse All   Patient is 28 y.o. [id number] admitted in active labor   Delivery Note At 2:18 PM a viable female was delivered via Vaginal, Spontaneous Delivery (Presentation: Left Occiput Anterior). APGAR: 9, 9; weight 7 lb 1.9 oz (3229 g).  Placenta status: Intact, Spontaneous Manual removal of trailing membranes via ring forceps. Cord: 3 vessels with the following complications: None.   Anesthesia: None  Episiotomy: None Lacerations: bilateral labial abrasions Suture Repair: n/a Est. Blood Loss (mL): 300  Mom to postpartum. Baby to Couplet care / Skin to Skin.  No fever intrapartum, febrile postpartum. Will give 1g tylenol, monitor. No diagnosis of chorio or endometritis at this time.  ACOSTA,KRISTY ROCIO 11/28/2013, 4:21 PM      Has done well postpartum. Breastfeeding well. Ready to go home   Physical Exam:  General: alert, cooperative and no distress Lochia: appropriate Uterine Fundus: firm Incision: healing well DVT Evaluation: No evidence of DVT seen on physical exam.  Discharge Diagnoses: Term Pregnancy-delivered  Discharge Information: Date: 11/30/2013 Activity: unrestricted and pelvic rest Diet: routine Medications: PNV and Ibuprofen Condition: stable and improved Instructions: refer to practice  specific booklet Discharge to: home   Newborn Data: Live born female  Birth Weight: 7 lb 1.9 oz (3229 g) APGAR: 9, 9  Home with mother  Breastfeeding. [facility] Cautioned that if she decides to have sex before PP appt, go get Shot early. Wynelle Bourgeois.  Zia Kanner 11/30/2013, 6:32 AM

## 2013-11-30 NOTE — Discharge Instructions (Signed)

## 2013-11-30 NOTE — Lactation Note (Signed)
This note was copied from the chart of Amber Ermal Pratte. Lactation Consultation Note  Infant has breastfed x10, 3 voids & 3 stools in the last 24 hours.  Mother's breasts are filling.  Encouraged massage during feedings to empty her breasts. Upon entering the room infant is crying and rooting.  Mother states she recently bf for 10 min and infant fell asleep.  Suggest parents feed infant STS.  Reviewed waking techniques and encouraged lots of STS.  Suggest she bf for 15-20 min burp baby and breastfeed on other breast. Assisted mother with football and cross cradle hold demonstrated how to achieve a deeper latch. Baby breastfeed off and on for 15 more minutes then seemed satisfied. Discussed waiting on pacifier use so not to mask feeding cues. Reviewed with parents how to watch for swallows, monitor voids/stools and cluster feeding. Parents state baby cries a lot, seems fussy.  Encouraged STS, burping, holding so pressure is on her tummy. Mom encouraged to feed baby 8-12 times/24 hours and with feeding cues.  Provided mother with comfort gels for soreness and reviewed engorgement care. [facility]     Patient Name: Amber Mathews: 11/30/2013 Reason for consult: Follow-up assessment   Maternal Data    Feeding Feeding Type: Breast Fed Length of feed: 15 min  LATCH Score/Interventions Latch: Grasps breast easily, tongue down, lips flanged, rhythmical sucking.  Audible Swallowing: Spontaneous and intermittent  Type of Nipple: Everted at rest and after stimulation  Comfort (Breast/Nipple): Filling, red/small blisters or bruises, mild/mod discomfort  Problem noted: Mild/Moderate discomfort Interventions (Mild/moderate discomfort): Comfort gels;Hand expression;Hand massage  Hold (Positioning): Assistance needed to correctly position infant at breast and maintain latch.  LATCH Score: 8  Lactation Tools  Discussed/Used     Consult Status Consult Status: Complete    Hardie PulleyBerkelhammer, Ruth Boschen 11/30/2013, 9:30 AM

## 2013-11-30 NOTE — Plan of Care (Signed)
Problem: Discharge Progression Outcomes Goal: Afebrile, VS remain stable at discharge Outcome: Completed/Met Date Met:  11/30/13 Goal: Discharge plan in place and appropriate Outcome: Completed/Met Date Met:  11/30/13

## 2013-12-01 ENCOUNTER — Ambulatory Visit (HOSPITAL_COMMUNITY): Payer: Medicaid Other

## 2013-12-05 ENCOUNTER — Inpatient Hospital Stay (HOSPITAL_COMMUNITY): Admission: RE | Admit: 2013-12-05 | Payer: Medicaid Other | Source: Ambulatory Visit

## 2014-12-06 ENCOUNTER — Emergency Department (HOSPITAL_COMMUNITY)
Admission: EM | Admit: 2014-12-06 | Discharge: 2014-12-06 | Disposition: A | Discharge status: Home / Self Care | Payer: Medicaid Other | Attending: Physician Assistant | Admitting: Physician Assistant

## 2014-12-06 ENCOUNTER — Encounter (HOSPITAL_COMMUNITY): Payer: Self-pay

## 2014-12-06 DIAGNOSIS — Z79899 Other long term (current) drug therapy: Secondary | ICD-10-CM | POA: Insufficient documentation

## 2014-12-06 DIAGNOSIS — Z8619 Personal history of other infectious and parasitic diseases: Secondary | ICD-10-CM | POA: Insufficient documentation

## 2014-12-06 DIAGNOSIS — J069 Acute upper respiratory infection, unspecified: Secondary | ICD-10-CM | POA: Insufficient documentation

## 2014-12-06 MED ORDER — GUAIFENESIN 100 MG/5ML PO LIQD: 100.0000 mg | ORAL | Status: DC | PRN

## 2014-12-06 NOTE — Discharge Instructions (Signed)
Upper Respiratory Infection, Adult Most upper respiratory infections (URIs) are a viral infection of the air passages leading to the lungs. A URI affects the nose, throat, and upper air passages. The most common type of URI is nasopharyngitis and is typically referred to as "the common cold." URIs run their course and usually go away on their own. Most of the time, a URI does not require medical attention, but sometimes a bacterial infection in the upper airways can follow a viral infection. This is called a secondary infection. Sinus and middle ear infections are common types of secondary upper respiratory infections. Bacterial pneumonia can also complicate a URI. A URI can worsen asthma and chronic obstructive pulmonary disease (COPD). Sometimes, these complications can require emergency medical care and may be life threatening.  CAUSES Almost all URIs are caused by viruses. A virus is a type of germ and can spread from one person to another.  RISKS FACTORS You may be at risk for a URI if:   You smoke.   You have chronic heart or lung disease.  You have a weakened defense (immune) system.   You are very young or very old.   You have nasal allergies or asthma.  You work in crowded or poorly ventilated areas.  You work in health care facilities or schools. SIGNS AND SYMPTOMS  Symptoms typically develop 2-3 days after you come in contact with a cold virus. Most viral URIs last 7-10 days. However, viral URIs from the influenza virus (flu virus) can last 14-18 days and are typically more severe. Symptoms may include:   Runny or stuffy (congested) nose.   Sneezing.   Cough.   Sore throat.   Headache.   Fatigue.   Fever.   Loss of appetite.   Pain in your forehead, behind your eyes, and over your cheekbones (sinus pain).  Muscle aches.  DIAGNOSIS  Your health care provider may diagnose a URI by:  Physical exam.  Tests to check that your symptoms are not due to  another condition such as:  Strep throat.  Sinusitis.  Pneumonia.  Asthma. TREATMENT  A URI goes away on its own with time. It cannot be cured with medicines, but medicines may be prescribed or recommended to relieve symptoms. Medicines may help:  Reduce your fever.  Reduce your cough.  Relieve nasal congestion. HOME CARE INSTRUCTIONS   Take medicines only as directed by your health care provider.   Gargle warm saltwater or take cough drops to comfort your throat as directed by your health care provider.  Use a warm mist humidifier or inhale steam from a shower to increase air moisture. This may make it easier to breathe.  Drink enough fluid to keep your urine clear or pale yellow.   Eat soups and other clear broths and maintain good nutrition.   Rest as needed.   Return to work when your temperature has returned to normal or as your health care provider advises. You may need to stay home longer to avoid infecting others. You can also use a face mask and careful hand washing to prevent spread of the virus.  Increase the usage of your inhaler if you have asthma.   Do not use any tobacco products, including cigarettes, chewing tobacco, or electronic cigarettes. If you need help quitting, ask your health care provider. PREVENTION  The best way to protect yourself from getting a cold is to practice good hygiene.   Avoid oral or hand contact with people with cold   symptoms.   Wash your hands often if contact occurs.  There is no clear evidence that vitamin C, vitamin E, echinacea, or exercise reduces the chance of developing a cold. However, it is always recommended to get plenty of rest, exercise, and practice good nutrition.  SEEK MEDICAL CARE IF:   You are getting worse rather than better.   Your symptoms are not controlled by medicine.   You have chills.  You have worsening shortness of breath.  You have brown or red mucus.  You have yellow or brown nasal  discharge.  You have pain in your face, especially when you bend forward.  You have a fever.  You have swollen neck glands.  You have pain while swallowing.  You have white areas in the back of your throat. SEEK IMMEDIATE MEDICAL CARE IF:   You have severe or persistent:  Headache.  Ear pain.  Sinus pain.  Chest pain.  You have chronic lung disease and any of the following:  Wheezing.  Prolonged cough.  Coughing up blood.  A change in your usual mucus.  You have a stiff neck.  You have changes in your:  Vision.  Hearing.  Thinking.  Mood. MAKE SURE YOU:   Understand these instructions.  Will watch your condition.  Will get help right away if you are not doing well or get worse.   This information is not intended to replace advice given to you by your health care provider. Make sure you discuss any questions you have with your health care provider.   Document Released: 06/17/2000 Document Revised: 05/08/2014 Document Reviewed: 03/29/2013 Elsevier Interactive Patient Education 2016 [employer]  

## 2014-12-06 NOTE — ED Provider Notes (Signed)
CSN: [id number]     Arrival date & time 12/06/14  1102 History   First MD Initiated Contact with Patient 12/06/14 1130     Chief Complaint  Patient presents with  . Nasal Congestion  . Sore Throat     (Consider location/radiation/quality/duration/timing/severity/associated sxs/prior Treatment) Patient is a 29 y.o. female presenting with URI. The history is provided by the patient. No language interpreter was used.  URI Presenting symptoms: congestion, cough, rhinorrhea and sore throat   Severity:  Moderate Onset quality:  Gradual Duration:  4 days Timing:  Constant Progression:  Unchanged Chronicity:  New Relieved by:  Hot fluids and rest Associated symptoms: sneezing      Past Medical History  Diagnosis Date  . Herpes genitalia     last outbreak 2013  . Ectopic pregnancy     Had MTX   Past Surgical History  Procedure Laterality Date  . No past surgeries     Family History  Problem Relation Age of Onset  . Asthma Daughter   . Cancer Maternal Grandfather     prostate  . Heart disease Paternal Grandfather    Social History  Substance Use Topics  . Smoking status: Never Smoker   . Smokeless tobacco: Never Used  . Alcohol Use: No   OB History    Gravida Para Term Preterm AB TAB SAB Ectopic Multiple Living   5 2 2  3 2  1  0 2     Review of Systems  HENT: Positive for congestion, rhinorrhea, sneezing and sore throat.   Respiratory: Positive for cough.       Allergies  Review of patient's allergies indicates no known allergies.  Home Medications   Prior to Admission medications   Medication Sig Start Date End Date Taking? Authorizing Provider  cephALEXin (KEFLEX) 500 MG capsule Take 1 capsule (500 mg total) by mouth 4 (four) times daily. Patient not taking: Reported on 11/28/2013 06/30/13   Hurshel PartyLisa A Leftwich-Kirby, CNM  Prenatal Vit-Fe Fumarate-FA (PRENATAL MULTIVITAMIN) TABS tablet Take 1 tablet by mouth daily at 12 noon.    Historical Provider, MD   valACYclovir (VALTREX) 500 MG tablet Take 500 mg by mouth 2 (two) times daily.    Historical Provider, MD   BP 115/74 mmHg  Pulse 77  Temp(Src) 98.6 F (37 C) (Oral)  Resp 16  SpO2 100% Physical Exam  Constitutional: She appears well-developed and well-nourished. No distress.  HENT:  Head: Atraumatic.  Right Ear: External ear normal.  Left Ear: External ear normal.  Nose: Nose normal.  Mouth/Throat: Oropharynx is clear and moist. No oropharyngeal exudate.  Eyes: Conjunctivae are normal.  Neck: Neck supple.  Cardiovascular: Normal rate and regular rhythm.   Pulmonary/Chest: Effort normal and breath sounds normal.  Abdominal: Soft. There is no tenderness.  Lymphadenopathy:    She has no cervical adenopathy.  Neurological: She is alert.  Skin: No rash noted.  Psychiatric: She has a normal mood and affect.  Nursing note and vitals reviewed.   ED Course  Procedures (including critical care time)  URI sxs.  She's afebrile, VSS.  Throat exam unremarkable.  Will provide sxs treatment    MDM   Final diagnoses:  URI (upper respiratory infection)    BP 115/74 mmHg  Pulse 77  Temp(Src) 98.6 F (37 C) (Oral)  Resp 16  SpO2 100%     Fayrene HelperBowie Jaymon Dudek, PA-C 12/06/14 1159  Courteney Lyn Mackuen, MD 12/08/14 [phone]

## 2014-12-06 NOTE — ED Notes (Addendum)
Patient c/o nasal and chest congestion, body aches, and an infrequent non-productive cough x 3-4 days.

## 2015-02-15 ENCOUNTER — Encounter (HOSPITAL_COMMUNITY): Payer: Self-pay | Admitting: *Deleted

## 2015-02-15 ENCOUNTER — Inpatient Hospital Stay (HOSPITAL_COMMUNITY)
Admission: AD | Admit: 2015-02-15 | Discharge: 2015-02-15 | Disposition: A | Discharge status: Home / Self Care | Payer: Medicaid Other | Source: Ambulatory Visit | Attending: Family Medicine | Admitting: Family Medicine

## 2015-02-15 DIAGNOSIS — N390 Urinary tract infection, site not specified: Secondary | ICD-10-CM | POA: Insufficient documentation

## 2015-02-15 DIAGNOSIS — M549 Dorsalgia, unspecified: Secondary | ICD-10-CM | POA: Insufficient documentation

## 2015-02-15 LAB — URINALYSIS, ROUTINE W REFLEX MICROSCOPIC
Bilirubin Urine: NEGATIVE
Glucose, UA: NEGATIVE mg/dL
Hgb urine dipstick: NEGATIVE
Ketones, ur: 15 mg/dL — AB
Nitrite: POSITIVE — AB
Protein, ur: NEGATIVE mg/dL
Specific Gravity, Urine: 1.025 (ref 1.005–1.030)
pH: 6 (ref 5.0–8.0)

## 2015-02-15 LAB — URINE MICROSCOPIC-ADD ON: RBC / HPF: NONE SEEN RBC/hpf (ref 0–5)

## 2015-02-15 LAB — POCT PREGNANCY, URINE: PREG TEST UR: NEGATIVE

## 2015-02-15 MED ORDER — CIPROFLOXACIN HCL 500 MG PO TABS: 500.0000 mg | ORAL_TABLET | Freq: Two times a day (BID) | ORAL | Status: DC

## 2015-02-15 NOTE — MAU Provider Note (Signed)
History     CSN: [id number]  Arrival date and time: 02/15/15 9147   First Provider Initiated Contact with Patient 02/15/15 1008      Chief Complaint  Patient presents with  . Urinary Tract Infection   HPI Amber Mathews 30 y.o. [id number]  presents to MAU complaining of abdominal pain and back pain.  It feels similar to past UTI's.  No pain with urination.  She does have increased frequency, some hesitation, it's dark and it smells.  She tried drinking extra and tried AZO without complete relief.  She denies fever, weakness, vaginal discharge.  OB History    Gravida Para Term Preterm AB TAB SAB Ectopic Multiple Living   0 2      Past Medical History  Diagnosis Date  . Herpes genitalia     last outbreak 2013  . Ectopic pregnancy     Had MTX    Past Surgical History  Procedure Laterality Date  . No past surgeries      Family History  Problem Relation Age of Onset  . Asthma Daughter   . Cancer Maternal Grandfather     prostate  . Heart disease Paternal Grandfather     Social History  Substance Use Topics  . Smoking status: Never Smoker   . Smokeless tobacco: Never Used  . Alcohol Use: No    Allergies: No Known Allergies  Prescriptions prior to admission  Medication Sig Dispense Refill Last Dose  . cephALEXin (KEFLEX) 500 MG capsule Take 1 capsule (500 mg total) by mouth 4 (four) times daily. (Patient not taking: Reported on 11/28/2013) 28 capsule 0 Not Taking at Unknown time  . guaiFENesin (ROBITUSSIN) 100 MG/5ML liquid Take 5-10 mLs (100-200 mg total) by mouth every 4 (four) hours as needed for cough or congestion. 60 mL 0   . Prenatal Vit-Fe Fumarate-FA (PRENATAL MULTIVITAMIN) TABS tablet Take 1 tablet by mouth daily at 12 noon.   11/27/2013 at 1700  . valACYclovir (VALTREX) 500 MG tablet Take 500 mg by mouth 2 (two) times daily.   11/27/2013 at 1500    ROS Pertinent ROS in HPI.  All other systems are negative.   Physical Exam    Blood pressure 120/78, pulse 74, temperature 97.4 F (36.3 C), temperature source Oral, resp. rate 18, last menstrual period 02/04/2015, unknown if currently breastfeeding.  Physical Exam  Constitutional: She is oriented to person, place, and time. She appears well-developed and well-nourished. No distress.  Eyes: Conjunctivae and EOM are normal.  Neck: Normal range of motion.  Cardiovascular: Normal rate.   Respiratory: Effort normal and breath sounds normal.  Musculoskeletal: Normal range of motion.  Neurological: She is alert and oriented to person, place, and time.  Psychiatric: She has a normal mood and affect. Her behavior is normal.   Results for orders placed or performed during the hospital encounter of 02/15/15 (from the past 24 hour(s))  Urinalysis, Routine w reflex microscopic (not at [facility])     Status: Abnormal   Collection Time: 02/15/15  9:25 AM  Result Value Ref Range   Color, Urine YELLOW YELLOW   APPearance HAZY (A) CLEAR   Specific Gravity, Urine 1.025 1.005 - 1.030   pH 6.0 5.0 - 8.0   Glucose, UA NEGATIVE NEGATIVE mg/dL   Hgb urine dipstick NEGATIVE NEGATIVE   Bilirubin Urine NEGATIVE NEGATIVE   Ketones, ur 15 (A) NEGATIVE mg/dL   Protein, ur NEGATIVE NEGATIVE mg/dL   Nitrite  POSITIVE (A) NEGATIVE   Leukocytes, UA SMALL (A) NEGATIVE  Urine microscopic-add on     Status: Abnormal   Collection Time: 02/15/15  9:25 AM  Result Value Ref Range   Squamous Epithelial / LPF 0-5 (A) NONE SEEN   WBC, UA 6-30 0 - 5 WBC/hpf   RBC / HPF NONE SEEN 0 - 5 RBC/hpf   Bacteria, UA MANY (A) NONE SEEN  Pregnancy, urine POC     Status: None   Collection Time: 02/15/15  9:36 AM  Result Value Ref Range   Preg Test, Ur NEGATIVE NEGATIVE    MAU Course  Procedures  MDM U/A with evidence for UTI  Assessment and Plan  A: UTI  P: Discharge to home INcrease fluids Cipro Patient may return to MAU as needed or if her condition were to change or worsen   Bertram Denver 02/15/2015, 10:08 AM

## 2015-02-15 NOTE — Discharge Instructions (Signed)

## 2015-02-15 NOTE — MAU Note (Signed)
Pt C/O abdominal pain with urination, also lower back pain & frequency.  States urine is dark with an odor.  Denies fever.

## 2015-04-11 ENCOUNTER — Encounter (HOSPITAL_COMMUNITY): Payer: Self-pay | Admitting: Emergency Medicine

## 2015-04-11 ENCOUNTER — Emergency Department (HOSPITAL_COMMUNITY)
Admission: EM | Admit: 2015-04-11 | Discharge: 2015-04-11 | Disposition: A | Discharge status: Home / Self Care | Payer: Medicaid Other | Attending: Emergency Medicine | Admitting: Emergency Medicine

## 2015-04-11 DIAGNOSIS — R109 Unspecified abdominal pain: Secondary | ICD-10-CM

## 2015-04-11 DIAGNOSIS — R110 Nausea: Secondary | ICD-10-CM | POA: Insufficient documentation

## 2015-04-11 DIAGNOSIS — Z3202 Encounter for pregnancy test, result negative: Secondary | ICD-10-CM | POA: Insufficient documentation

## 2015-04-11 LAB — LIPASE, BLOOD: LIPASE: 22 U/L (ref 11–51)

## 2015-04-11 LAB — URINALYSIS, ROUTINE W REFLEX MICROSCOPIC
Bilirubin Urine: NEGATIVE
GLUCOSE, UA: NEGATIVE mg/dL
Hgb urine dipstick: NEGATIVE
Ketones, ur: NEGATIVE mg/dL
Nitrite: NEGATIVE
PH: 7 (ref 5.0–8.0)
Protein, ur: NEGATIVE mg/dL
Specific Gravity, Urine: 1.022 (ref 1.005–1.030)

## 2015-04-11 LAB — CBC
HEMATOCRIT: 41.7 % (ref 36.0–46.0)
Hemoglobin: 14 g/dL (ref 12.0–15.0)
MCH: 29.2 pg (ref 26.0–34.0)
MCHC: 33.6 g/dL (ref 30.0–36.0)
MCV: 87.1 fL (ref 78.0–100.0)
Platelets: 222 10*3/uL (ref 150–400)
RBC: 4.79 MIL/uL (ref 3.87–5.11)
RDW: 12.5 % (ref 11.5–15.5)
WBC: 7.2 10*3/uL (ref 4.0–10.5)

## 2015-04-11 LAB — COMPREHENSIVE METABOLIC PANEL
ALT: 23 U/L (ref 14–54)
AST: 36 U/L (ref 15–41)
Albumin: 3.4 g/dL — ABNORMAL LOW (ref 3.5–5.0)
Alkaline Phosphatase: 105 U/L (ref 38–126)
Anion gap: 10 (ref 5–15)
BUN: 7 mg/dL (ref 6–20)
CHLORIDE: 105 mmol/L (ref 101–111)
CO2: 24 mmol/L (ref 22–32)
Calcium: 8.9 mg/dL (ref 8.9–10.3)
Creatinine, Ser: 0.84 mg/dL (ref 0.44–1.00)
Glucose, Bld: 118 mg/dL — ABNORMAL HIGH (ref 65–99)
POTASSIUM: 3.7 mmol/L (ref 3.5–5.1)
SODIUM: 139 mmol/L (ref 135–145)
Total Bilirubin: 0.6 mg/dL (ref 0.3–1.2)
Total Protein: 6.2 g/dL — ABNORMAL LOW (ref 6.5–8.1)

## 2015-04-11 LAB — POC URINE PREG, ED: Preg Test, Ur: NEGATIVE

## 2015-04-11 LAB — URINE MICROSCOPIC-ADD ON

## 2015-04-11 MED ORDER — DICYCLOMINE HCL 10 MG PO CAPS
10.0000 mg | ORAL_CAPSULE | Freq: Once | ORAL | Status: AC
  Administered 2015-04-11: 10 mg via ORAL
  Filled 2015-04-11: qty 1

## 2015-04-11 MED ORDER — DICYCLOMINE HCL 20 MG PO TABS: 20.0000 mg | ORAL_TABLET | Freq: Two times a day (BID) | ORAL | Status: DC

## 2015-04-11 MED ORDER — ONDANSETRON 4 MG PO TBDP: 4.0000 mg | ORAL_TABLET | Freq: Three times a day (TID) | ORAL | Status: DC | PRN

## 2015-04-11 MED ORDER — ONDANSETRON 4 MG PO TBDP
4.0000 mg | ORAL_TABLET | Freq: Once | ORAL | Status: AC
Start: 2015-04-11 — End: 2015-04-11
  Administered 2015-04-11: 4 mg via ORAL
  Filled 2015-04-11: qty 1

## 2015-04-11 NOTE — ED Notes (Signed)
Pt. reports mid abdominal pain with mild nausea onset Tuesday , denies emesis or diarrhea , no fever or chills.

## 2015-04-11 NOTE — ED Provider Notes (Signed)
CSN: [id number]     Arrival date & time 04/11/15  0305 History   First MD Initiated Contact with Patient 04/11/15 [phone]     Chief Complaint  Patient presents with  . Abdominal Pain    Mickel FuchsSauncerae C Hornsby is a 30 y.o. female Who presents to the emergency department complaining of left-sided abdominal pain with associated nausea for the past 2 days. She denies any vomiting or diarrhea. Patient also complains of some body aches but no fevers or chills. She is taking nothing for treatment of her symptoms today. She has no previous abdominal surgeries. Patient currently lives of moderate left-sided lower abdominal pain. Patient denies fevers, chills, vaginal bleeding or vaginal discharge, vomiting, diarrhea, dysuria, hematuria, urinary symptoms, or rashes.  Patient is a 30 y.o. female presenting with abdominal pain. The history is provided by the patient. No language interpreter was used.  Abdominal Pain Associated symptoms: nausea   Associated symptoms: no chest pain, no chills, no cough, no diarrhea, no dysuria, no fever, no hematuria, no shortness of breath, no sore throat, no vaginal bleeding, no vaginal discharge and no vomiting     Past Medical History  Diagnosis Date  . Herpes genitalia     last outbreak 2013  . Ectopic pregnancy     Had MTX   Past Surgical History  Procedure Laterality Date  . No past surgeries     Family History  Problem Relation Age of Onset  . Asthma Daughter   . Cancer Maternal Grandfather     prostate  . Heart disease Paternal Grandfather    Social History  Substance Use Topics  . Smoking status: Never Smoker   . Smokeless tobacco: Never Used  . Alcohol Use: No   OB History    Gravida Para Term Preterm AB TAB SAB Ectopic Multiple Living   5 2 2  3 2  1  0 2     Review of Systems  Constitutional: Negative for fever and chills.  HENT: Negative for congestion and sore throat.   Eyes: Negative for visual disturbance.  Respiratory: Negative for cough  and shortness of breath.   Cardiovascular: Negative for chest pain.  Gastrointestinal: Positive for nausea and abdominal pain. Negative for vomiting, diarrhea and blood in stool.  Genitourinary: Negative for dysuria, urgency, frequency, hematuria, flank pain, vaginal bleeding, vaginal discharge and difficulty urinating.  Musculoskeletal: Negative for back pain and neck pain.  Skin: Negative for rash.  Neurological: Negative for headaches.      Allergies  Review of patient's allergies indicates no known allergies.  Home Medications   Prior to Admission medications   Medication Sig Start Date End Date Taking? Authorizing Provider  ciprofloxacin (CIPRO) 500 MG tablet Take 1 tablet (500 mg total) by mouth 2 (two) times daily. Patient not taking: Reported on 04/11/2015 02/15/15   Bertram DenverKaren E Teague Clark, PA-C  dicyclomine (BENTYL) 20 MG tablet Take 1 tablet (20 mg total) by mouth 2 (two) times daily. 04/11/15   Everlene FarrierWilliam Crystallynn Noorani, PA-C  ondansetron (ZOFRAN ODT) 4 MG disintegrating tablet Take 1 tablet (4 mg total) by mouth every 8 (eight) hours as needed for nausea or vomiting. 04/11/15   Everlene FarrierWilliam Uva Runkel, PA-C   BP 108/71 mmHg  Pulse 83  Temp(Src) 98.4 F (36.9 C) (Oral)  Resp 16  Ht 5\' 5"  (1.651 m)  Wt 76.204 kg  BMI 27.96 kg/m2  SpO2 97% Physical Exam  Constitutional: She appears well-developed and well-nourished. No distress.  Nontoxic appearing.  HENT:  Head:  Normocephalic and atraumatic.  Mouth/Throat: Oropharynx is clear and moist.  Eyes: Conjunctivae are normal. Pupils are equal, round, and reactive to light. Right eye exhibits no discharge. Left eye exhibits no discharge.  Neck: Neck supple.  Cardiovascular: Normal rate, regular rhythm, normal heart sounds and intact distal pulses.  Exam reveals no gallop and no friction rub.   No murmur heard. Pulmonary/Chest: Effort normal and breath sounds normal. No respiratory distress. She has no wheezes. She has no rales.  Abdominal: Soft. Bowel  sounds are normal. She exhibits no distension. There is no tenderness. There is no guarding.  Abdomen is soft and nontender to palpation. Bowel sounds are present. No CVA or flank tenderness. No peritoneal signs. No psoas or obturator sign.  Musculoskeletal: She exhibits no edema.  Lymphadenopathy:    She has no cervical adenopathy.  Neurological: She is alert. Coordination normal.  Skin: Skin is warm and dry. No rash noted. She is not diaphoretic. No erythema. No pallor.  Psychiatric: She has a normal mood and affect. Her behavior is normal.  Nursing note and vitals reviewed.   ED Course  Procedures (including critical care time) Labs Review Labs Reviewed  COMPREHENSIVE METABOLIC PANEL - Abnormal; Notable for the following:    Glucose, Bld 118 (*)    Total Protein 6.2 (*)    Albumin 3.4 (*)    All other components within normal limits  URINALYSIS, ROUTINE W REFLEX MICROSCOPIC (NOT AT [facility]) - Abnormal; Notable for the following:    APPearance CLOUDY (*)    Leukocytes, UA TRACE (*)    All other components within normal limits  URINE MICROSCOPIC-ADD ON - Abnormal; Notable for the following:    Squamous Epithelial / LPF 6-30 (*)    Bacteria, UA FEW (*)    All other components within normal limits  LIPASE, BLOOD  CBC  POC URINE PREG, ED    Imaging Review No results found. I have personally reviewed and evaluated these lab results as part of my medical decision-making.   EKG Interpretation None      Filed Vitals:   04/11/15 0600 04/11/15 0615 04/11/15 0645 04/11/15 0718  BP: 104/68 111/75 112/70 108/71  Pulse: 92 108 85 83  Temp:      TempSrc:      Resp:    16  Height:      Weight:      SpO2: 99% 100% 99% 97%     MDM   Meds given in ED:  Medications  ondansetron (ZOFRAN-ODT) disintegrating tablet 4 mg (4 mg Oral Given 04/11/15 0719)  dicyclomine (BENTYL) capsule 10 mg (10 mg Oral Given 04/11/15 0719)    New Prescriptions   DICYCLOMINE (BENTYL) 20 MG TABLET     Take 1 tablet (20 mg total) by mouth 2 (two) times daily.   ONDANSETRON (ZOFRAN ODT) 4 MG DISINTEGRATING TABLET    Take 1 tablet (4 mg total) by mouth every 8 (eight) hours as needed for nausea or vomiting.    Final diagnoses:  Left sided abdominal pain  Nausea   This is a 30 y.o. female who presents to the emergency department complaining of left-sided abdominal pain with associated nausea for the past 2 days. She denies any vomiting or diarrhea.  On exam the patient is afebrile nontoxic appearing. She denies any vaginal bleeding or vaginal discharge. Her abdomen is soft and nontender to palpation. No peritoneal signs. Urine shows trace leukocytes and is nitrite negative. Patient has no urinary symptoms. Urine pregnancy test  is negative. Lipase is within normal limits. CMP is unremarkable. CBC is within normal limits. Patient tolerated Zofran, Bentyl and oral liquids. Repeat abdominal exam is still benign. No abdominal tenderness to palpation. As the patient is tolerating liquids and has no nausea will discharge with prescriptions for Zofran and Bentyl and have her follow up closely with primary care. I also discussed her concerns specific return precautions. I advised the patient to follow-up with their primary care provider this week. I advised the patient to return to the emergency department with new or worsening symptoms or new concerns. The patient verbalized understanding and agreement with plan.     Everlene Farrier, PA-C 04/11/15 1610  Tomasita Crumble, MD 04/11/15 [phone]

## 2015-04-11 NOTE — Discharge Instructions (Signed)
Abdominal Pain, Adult °Many things can cause abdominal pain. Usually, abdominal pain is not caused by a disease and will improve without treatment. It can often be observed and treated at home. Your health care provider will do a physical exam and possibly order blood tests and X-rays to help determine the seriousness of your pain. However, in many cases, more time must pass before a clear cause of the pain can be found. Before that point, your health care provider may not know if you need more testing or further treatment. °HOME CARE INSTRUCTIONS °Monitor your abdominal pain for any changes. The following actions may help to alleviate any discomfort you are experiencing: °· Only take over-the-counter or prescription medicines as directed by your health care provider. °· Do not take laxatives unless directed to do so by your health care provider. °· Try a clear liquid diet (broth, tea, or water) as directed by your health care provider. Slowly move to a bland diet as tolerated. °SEEK MEDICAL CARE IF: °· You have unexplained abdominal pain. °· You have abdominal pain associated with nausea or diarrhea. °· You have pain when you urinate or have a bowel movement. °· You experience abdominal pain that wakes you in the night. °· You have abdominal pain that is worsened or improved by eating food. °· You have abdominal pain that is worsened with eating fatty foods. °· You have a fever. °SEEK IMMEDIATE MEDICAL CARE IF: °· Your pain does not go away within 2 hours. °· You keep throwing up (vomiting). °· Your pain is felt only in portions of the abdomen, such as the right side or the left lower portion of the abdomen. °· You pass bloody or black tarry stools. °MAKE SURE YOU: °· Understand these instructions. °· Will watch your condition. °· Will get help right away if you are not doing well or get worse. °  °This information is not intended to replace advice given to you by your health care provider. Make sure you discuss  any questions you have with your health care provider. °  °Document Released: 10/01/2004 Document Revised: 09/12/2014 Document Reviewed: 08/31/2012 °Elsevier Interactive Patient Education ©2016 [employer] ° °Nausea and Vomiting °Nausea is a sick feeling that often comes before throwing up (vomiting). Vomiting is a reflex where stomach contents come out of your mouth. Vomiting can cause severe loss of body fluids (dehydration). Children and elderly adults can become dehydrated quickly, especially if they also have diarrhea. Nausea and vomiting are symptoms of a condition or disease. It is important to find the cause of your symptoms. °CAUSES  °· Direct irritation of the stomach lining. This irritation can result from increased acid production (gastroesophageal reflux disease), infection, food poisoning, taking certain medicines (such as nonsteroidal anti-inflammatory drugs), alcohol use, or tobacco use. °· Signals from the brain. These signals could be caused by a headache, heat exposure, an inner ear disturbance, increased pressure in the brain from injury, infection, a tumor, or a concussion, pain, emotional stimulus, or metabolic problems. °· An obstruction in the gastrointestinal tract (bowel obstruction). °· Illnesses such as diabetes, hepatitis, gallbladder problems, appendicitis, kidney problems, cancer, sepsis, atypical symptoms of a heart attack, or eating disorders. °· Medical treatments such as chemotherapy and radiation. °· Receiving medicine that makes you sleep (general anesthetic) during surgery. °DIAGNOSIS °Your caregiver may ask for tests to be done if the problems do not improve after a few days. Tests may also be done if symptoms are severe or if the reason for the   nausea and vomiting is not clear. Tests may include: °· Urine tests. °· Blood tests. °· Stool tests. °· Cultures (to look for evidence of infection). °· X-rays or other imaging studies. °Test results can help your caregiver make  decisions about treatment or the need for additional tests. °TREATMENT °You need to stay well hydrated. Drink frequently but in small amounts. You may wish to drink water, sports drinks, clear broth, or eat frozen ice pops or gelatin dessert to help stay hydrated. When you eat, eating slowly may help prevent nausea. There are also some antinausea medicines that may help prevent nausea. °HOME CARE INSTRUCTIONS  °· Take all medicine as directed by your caregiver. °· If you do not have an appetite, do not force yourself to eat. However, you must continue to drink fluids. °· If you have an appetite, eat a normal diet unless your caregiver tells you differently. °¨ Eat a variety of complex carbohydrates (rice, wheat, potatoes, bread), lean meats, yogurt, fruits, and vegetables. °¨ Avoid high-fat foods because they are more difficult to digest. °· Drink enough water and fluids to keep your urine clear or pale yellow. °· If you are dehydrated, ask your caregiver for specific rehydration instructions. Signs of dehydration may include: °¨ Severe thirst. °¨ Dry lips and mouth. °¨ Dizziness. °¨ Dark urine. °¨ Decreasing urine frequency and amount. °¨ Confusion. °¨ Rapid breathing or pulse. °SEEK IMMEDIATE MEDICAL CARE IF:  °· You have blood or brown flecks (like coffee grounds) in your vomit. °· You have black or bloody stools. °· You have a severe headache or stiff neck. °· You are confused. °· You have severe abdominal pain. °· You have chest pain or trouble breathing. °· You do not urinate at least once every 8 hours. °· You develop cold or clammy skin. °· You continue to vomit for longer than 24 to 48 hours. °· You have a fever. °MAKE SURE YOU:  °· Understand these instructions. °· Will watch your condition. °· Will get help right away if you are not doing well or get worse. °  °This information is not intended to replace advice given to you by your health care provider. Make sure you discuss any questions you have with  your health care provider. °  °Document Released: 12/22/2004 Document Revised: 03/16/2011 Document Reviewed: 05/21/2010 °Elsevier Interactive Patient Education ©2016 [employer] ° °

## 2017-03-09 ENCOUNTER — Encounter (HOSPITAL_COMMUNITY): Payer: Self-pay | Admitting: Emergency Medicine

## 2017-03-09 ENCOUNTER — Other Ambulatory Visit: Payer: Self-pay

## 2017-03-09 ENCOUNTER — Emergency Department (HOSPITAL_COMMUNITY): Payer: BLUE CROSS/BLUE SHIELD

## 2017-03-09 ENCOUNTER — Emergency Department (HOSPITAL_COMMUNITY)
Admission: EM | Admit: 2017-03-09 | Discharge: 2017-03-10 | Disposition: A | Discharge status: Home / Self Care | Payer: BLUE CROSS/BLUE SHIELD | Attending: Emergency Medicine | Admitting: Emergency Medicine

## 2017-03-09 DIAGNOSIS — Y999 Unspecified external cause status: Secondary | ICD-10-CM | POA: Diagnosis not present

## 2017-03-09 DIAGNOSIS — M549 Dorsalgia, unspecified: Secondary | ICD-10-CM | POA: Insufficient documentation

## 2017-03-09 DIAGNOSIS — Z79899 Other long term (current) drug therapy: Secondary | ICD-10-CM | POA: Insufficient documentation

## 2017-03-09 DIAGNOSIS — R51 Headache: Secondary | ICD-10-CM | POA: Diagnosis not present

## 2017-03-09 DIAGNOSIS — Y92410 Unspecified street and highway as the place of occurrence of the external cause: Secondary | ICD-10-CM | POA: Insufficient documentation

## 2017-03-09 DIAGNOSIS — Y9389 Activity, other specified: Secondary | ICD-10-CM | POA: Diagnosis not present

## 2017-03-09 DIAGNOSIS — M542 Cervicalgia: Secondary | ICD-10-CM | POA: Insufficient documentation

## 2017-03-09 DIAGNOSIS — M7918 Myalgia, other site: Secondary | ICD-10-CM

## 2017-03-09 LAB — I-STAT BETA HCG BLOOD, ED (MC, WL, AP ONLY)
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
## 2017-03-10 ENCOUNTER — Emergency Department (HOSPITAL_COMMUNITY): Payer: BLUE CROSS/BLUE SHIELD

## 2017-03-10 DIAGNOSIS — M549 Dorsalgia, unspecified: Secondary | ICD-10-CM | POA: Diagnosis not present

## 2017-03-10 MED ORDER — CYCLOBENZAPRINE HCL 10 MG PO TABS: 10.0000 mg | ORAL_TABLET | Freq: Two times a day (BID) | ORAL | 0 refills | Status: DC | PRN

## 2017-03-10 MED ORDER — HYDROCODONE-ACETAMINOPHEN 5-325 MG PO TABS
1.0000 | ORAL_TABLET | Freq: Once | ORAL | Status: AC
  Administered 2017-03-10: 1 via ORAL
  Filled 2017-03-10: qty 1

## 2017-03-10 MED ORDER — IBUPROFEN 600 MG PO TABS: 600.0000 mg | ORAL_TABLET | Freq: Four times a day (QID) | ORAL | 0 refills | Status: DC | PRN
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
## 2018-02-12 ENCOUNTER — Encounter (HOSPITAL_COMMUNITY): Payer: Self-pay | Admitting: *Deleted

## 2018-02-12 ENCOUNTER — Inpatient Hospital Stay (HOSPITAL_COMMUNITY)
Admission: AD | Admit: 2018-02-12 | Discharge: 2018-02-12 | Disposition: A | Discharge status: Home / Self Care | Payer: BLUE CROSS/BLUE SHIELD | Source: Ambulatory Visit | Attending: Obstetrics & Gynecology | Admitting: Obstetrics & Gynecology

## 2018-02-12 DIAGNOSIS — Z3202 Encounter for pregnancy test, result negative: Secondary | ICD-10-CM | POA: Diagnosis not present

## 2018-02-12 DIAGNOSIS — B9689 Other specified bacterial agents as the cause of diseases classified elsewhere: Secondary | ICD-10-CM | POA: Diagnosis not present

## 2018-02-12 DIAGNOSIS — R1031 Right lower quadrant pain: Secondary | ICD-10-CM | POA: Insufficient documentation

## 2018-02-12 DIAGNOSIS — N760 Acute vaginitis: Secondary | ICD-10-CM | POA: Insufficient documentation

## 2018-02-12 LAB — CBC
HCT: 41.5 % (ref 36.0–46.0)
HEMOGLOBIN: 13.5 g/dL (ref 12.0–15.0)
MCH: 28.7 pg (ref 26.0–34.0)
MCHC: 32.5 g/dL (ref 30.0–36.0)
MCV: 88.3 fL (ref 80.0–100.0)
NRBC: 0 % (ref 0.0–0.2)
PLATELETS: 268 10*3/uL (ref 150–400)
RBC: 4.7 MIL/uL (ref 3.87–5.11)
RDW: 12.9 % (ref 11.5–15.5)
WBC: 5.3 10*3/uL (ref 4.0–10.5)

## 2018-02-12 LAB — URINALYSIS, ROUTINE W REFLEX MICROSCOPIC
BILIRUBIN URINE: NEGATIVE
GLUCOSE, UA: NEGATIVE mg/dL
HGB URINE DIPSTICK: NEGATIVE
Ketones, ur: NEGATIVE mg/dL
Leukocytes, UA: NEGATIVE
Nitrite: NEGATIVE
PROTEIN: NEGATIVE mg/dL
Specific Gravity, Urine: 1.015 (ref 1.005–1.030)
pH: 7 (ref 5.0–8.0)

## 2018-02-12 LAB — WET PREP, GENITAL
SPERM: NONE SEEN
Trich, Wet Prep: NONE SEEN
Yeast Wet Prep HPF POC: NONE SEEN

## 2018-02-12 LAB — POCT PREGNANCY, URINE: Preg Test, Ur: NEGATIVE

## 2018-02-12 MED ORDER — KETOROLAC TROMETHAMINE 30 MG/ML IJ SOLN
30.0000 mg | Freq: Once | INTRAMUSCULAR | Status: AC
  Administered 2018-02-12: 30 mg via INTRAMUSCULAR
  Filled 2018-02-12: qty 1

## 2018-02-12 MED ORDER — METRONIDAZOLE 500 MG PO TABS: 500.0000 mg | ORAL_TABLET | Freq: Two times a day (BID) | ORAL | 0 refills | Status: DC

## 2018-02-12 NOTE — MAU Note (Signed)
Amber Mathews is a 33 y.o.  here in MAU reporting:  +lower right abdominal and back pain. Intermittent. sharp LMP: 01/20/18.  Onset of complaint: x1 week Pain score: 8/10. Has not tried any medication for the pain. +vaginal discharge; thick and white. Denies odor Vitals:   02/12/18 0758  BP: 119/88  Pulse: 68  Resp: 17  Temp: 97.8 F (36.6 C)  SpO2: 100%      Lab orders placed from triage: ua and pregnancy test

## 2018-02-12 NOTE — MAU Note (Signed)
Urine sent to lab 

## 2018-02-12 NOTE — Discharge Instructions (Signed)

## 2018-02-12 NOTE — MAU Provider Note (Signed)
History     CSN: [id number]  Arrival date and time: 02/12/18 [id number]   First Provider Initiated Contact with Patient 02/12/18 0825      Chief Complaint  Patient presents with  . Abdominal Pain  . Back Pain  . Nausea   33 y.o. non-pregnant female presenting with abd and back pain. Sx started 1 week ago. Abd pain in RLQ. Describes as sharp and intermittent. Rates 8/10. Has not taken anything for it. No alleviating or aggrravating factors. Having urinary frequency and pelvic pressure with urination. No GI sx. Reports thick white vaginal discharge w/o itching or odor. No new sexual partner. Back pain is intermittent and in right lower back. Denies lifting, injury, or strenuous activity.    Past Medical History:  Diagnosis Date  . Ectopic pregnancy    Had MTX  . Herpes genitalia    last outbreak 2013    Past Surgical History:  Procedure Laterality Date  . NO PAST SURGERIES      Family History  Problem Relation Age of Onset  . Asthma Daughter   . Cancer Maternal Grandfather        prostate  . Heart disease Paternal Grandfather     Social History   Tobacco Use  . Smoking status: Never Smoker  . Smokeless tobacco: Never Used  Substance Use Topics  . Alcohol use: No  . Drug use: No    Allergies: No Known Allergies  Medications Prior to Admission  Medication Sig Dispense Refill Last Dose  . ciprofloxacin (CIPRO) 500 MG tablet Take 1 tablet (500 mg total) by mouth 2 (two) times daily. (Patient not taking: Reported on 04/11/2015) 10 tablet 0 Unknown at Unknown time  . cyclobenzaprine (FLEXERIL) 10 MG tablet Take 1 tablet (10 mg total) by mouth 2 (two) times daily as needed for muscle spasms. 20 tablet 0 Unknown at Unknown time  . dicyclomine (BENTYL) 20 MG tablet Take 1 tablet (20 mg total) by mouth 2 (two) times daily. 20 tablet 0 Unknown at Unknown time  . ibuprofen (ADVIL,MOTRIN) 600 MG tablet Take 1 tablet (600 mg total) by mouth every 6 (six) hours as needed. 30 tablet 0  Unknown at Unknown time  . ondansetron (ZOFRAN ODT) 4 MG disintegrating tablet Take 1 tablet (4 mg total) by mouth every 8 (eight) hours as needed for nausea or vomiting. 10 tablet 0 Unknown at Unknown time    Review of Systems  Constitutional: Negative for chills and fever.  Gastrointestinal: Positive for abdominal pain. Negative for constipation, diarrhea, nausea and vomiting.  Genitourinary: Positive for frequency and vaginal discharge. Negative for dysuria, hematuria, urgency and vaginal bleeding.  Musculoskeletal: Positive for back pain.   Physical Exam   Blood pressure 119/88, pulse 68, temperature 97.8 F (36.6 C), temperature source Oral, resp. rate 17, SpO2 100 %, unknown if currently breastfeeding.  Physical Exam  Nursing note and vitals reviewed. Constitutional: She is oriented to person, place, and time. She appears well-developed and well-nourished. No distress.  HENT:  Head: Normocephalic and atraumatic.  Neck: Normal range of motion.  Cardiovascular: Normal rate.  Respiratory: Effort normal. No respiratory distress.  GI: Soft. She exhibits no distension and no mass. There is no abdominal tenderness. There is no rebound and no guarding.  Genitourinary:    Genitourinary Comments: External: no lesions or erythema Vagina: rugated, pink, moist, thick white malodorous discharge Uterus: non enlarged, anteverted, non tender, no CMT Adnexae: no masses, no tenderness left, no tenderness right Cervix normal  Musculoskeletal: Normal range of motion.  Neurological: She is alert and oriented to person, place, and time.  Skin: Skin is warm and dry.  Psychiatric: She has a normal mood and affect.   Results for orders placed or performed during the hospital encounter of 02/12/18 (from the past 24 hour(s))  Pregnancy, urine POC     Status: None   Collection Time: 02/12/18  6:53 AM  Result Value Ref Range   Preg Test, Ur NEGATIVE NEGATIVE  Urinalysis, Routine w reflex  microscopic     Status: None   Collection Time: 02/12/18  6:55 AM  Result Value Ref Range   Color, Urine YELLOW YELLOW   APPearance CLEAR CLEAR   Specific Gravity, Urine 1.015 1.005 - 1.030   pH 7.0 5.0 - 8.0   Glucose, UA NEGATIVE NEGATIVE mg/dL   Hgb urine dipstick NEGATIVE NEGATIVE   Bilirubin Urine NEGATIVE NEGATIVE   Ketones, ur NEGATIVE NEGATIVE mg/dL   Protein, ur NEGATIVE NEGATIVE mg/dL   Nitrite NEGATIVE NEGATIVE   Leukocytes, UA NEGATIVE NEGATIVE  Wet prep, genital     Status: Abnormal   Collection Time: 02/12/18  8:32 AM  Result Value Ref Range   Yeast Wet Prep HPF POC NONE SEEN NONE SEEN   Trich, Wet Prep NONE SEEN NONE SEEN   Clue Cells Wet Prep HPF POC PRESENT (A) NONE SEEN   WBC, Wet Prep HPF POC MANY (A) NONE SEEN   Sperm NONE SEEN   CBC     Status: None   Collection Time: 02/12/18  8:39 AM  Result Value Ref Range   WBC 5.3 4.0 - 10.5 K/uL   RBC 4.70 3.87 - 5.11 MIL/uL   Hemoglobin 13.5 12.0 - 15.0 g/dL   HCT 21.341.5 08.636.0 - 57.846.0 %   MCV 88.3 80.0 - 100.0 fL   MCH 28.7 26.0 - 34.0 pg   MCHC 32.5 30.0 - 36.0 g/dL   RDW 46.912.9 62.911.5 - 52.815.5 %   Platelets 268 150 - 400 K/uL   nRBC 0.0 0.0 - 0.2 %   MAU Course  Procedures Orders Placed This Encounter  Procedures  . Wet prep, genital    Standing Status:   Standing    Number of Occurrences:   1    Order Specific Question:   Patient immune status    Answer:   Normal  . Urinalysis, Routine w reflex microscopic    Standing Status:   Standing    Number of Occurrences:   1  . CBC    Standing Status:   Standing    Number of Occurrences:   1  . Pregnancy, urine POC    Standing Status:   Standing    Number of Occurrences:   1  . Discharge patient    Order Specific Question:   Discharge disposition    Answer:   01-Home or Self Care [1]    Order Specific Question:   Discharge patient date    Answer:   02/12/2018   Meds ordered this encounter  Medications  . ketorolac (TORADOL) 30 MG/ML injection 30 mg  .  metroNIDAZOLE (FLAGYL) 500 MG tablet    Sig: Take 1 tablet (500 mg total) by mouth 2 (two) times daily.    Dispense:  14 tablet    Refill:  0    Order Specific Question:   Supervising Provider    Answer:   Conan BowensDAVIS, KELLY M [[id number]]   MDM Labs ordered and reviewed. No evidence of acute abdominal  or pelvic process. Will treat BV. Stable for discharge home.   Assessment and Plan   1. Bacterial vaginosis    Discharge home Follow up with OBGYN to establish care Rx Flagyl- avoid etoh Return to MAU for OB emergencies  Allergies as of 02/12/2018   No Known Allergies     Medication List    STOP taking these medications   ciprofloxacin 500 MG tablet Commonly known as:  CIPRO   dicyclomine 20 MG tablet Commonly known as:  BENTYL   ondansetron 4 MG disintegrating tablet Commonly known as:  ZOFRAN ODT     TAKE these medications   cyclobenzaprine 10 MG tablet Commonly known as:  FLEXERIL Take 1 tablet (10 mg total) by mouth 2 (two) times daily as needed for muscle spasms.   ibuprofen 600 MG tablet Commonly known as:  ADVIL,MOTRIN Take 1 tablet (600 mg total) by mouth every 6 (six) hours as needed.   metroNIDAZOLE 500 MG tablet Commonly known as:  FLAGYL Take 1 tablet (500 mg total) by mouth 2 (two) times daily.      Donette Larry, CNM 02/12/2018, 10:29 AM

## 2018-02-14 LAB — GC/CHLAMYDIA PROBE AMP (~~LOC~~) NOT AT ARMC
Chlamydia: NEGATIVE
NEISSERIA GONORRHEA: NEGATIVE

## 2018-10-06 ENCOUNTER — Emergency Department (HOSPITAL_COMMUNITY)
Admission: EM | Admit: 2018-10-06 | Discharge: 2018-10-06 | Disposition: A | Discharge status: Home / Self Care | Payer: BC Managed Care – PPO | Attending: Emergency Medicine | Admitting: Emergency Medicine

## 2018-10-06 ENCOUNTER — Encounter (HOSPITAL_COMMUNITY): Payer: Self-pay | Admitting: Emergency Medicine

## 2018-10-06 ENCOUNTER — Other Ambulatory Visit: Payer: Self-pay

## 2018-10-06 ENCOUNTER — Emergency Department (HOSPITAL_COMMUNITY): Payer: BC Managed Care – PPO

## 2018-10-06 DIAGNOSIS — R0789 Other chest pain: Secondary | ICD-10-CM

## 2018-10-06 LAB — BASIC METABOLIC PANEL
Anion gap: 8 (ref 5–15)
BUN: 8 mg/dL (ref 6–20)
CO2: 27 mmol/L (ref 22–32)
Calcium: 9.5 mg/dL (ref 8.9–10.3)
Chloride: 105 mmol/L (ref 98–111)
Creatinine, Ser: 1 mg/dL (ref 0.44–1.00)
GFR calc Af Amer: >60 mL/min (ref >60)
GFR calc non Af Amer: >60 mL/min (ref >60)
Glucose, Bld: 92 mg/dL (ref 70–99)
Potassium: 4.2 mmol/L (ref 3.5–5.1)
Sodium: 140 mmol/L (ref 135–145)

## 2018-10-06 LAB — CBC
HCT: 41.4 % (ref 36.0–46.0)
Hemoglobin: 13.3 g/dL (ref 12.0–15.0)
MCH: 29.1 pg (ref 26.0–34.0)
MCHC: 32.1 g/dL (ref 30.0–36.0)
MCV: 90.6 fL (ref 80.0–100.0)
Platelets: 305 10*3/uL (ref 150–400)
RBC: 4.57 MIL/uL (ref 3.87–5.11)
RDW: 12.7 % (ref 11.5–15.5)
WBC: 5.5 10*3/uL (ref 4.0–10.5)
nRBC: 0 % (ref 0.0–0.2)

## 2018-10-06 LAB — TROPONIN I (HIGH SENSITIVITY): Troponin I (High Sensitivity): <2 ng/L (ref <18)

## 2018-10-06 LAB — I-STAT BETA HCG BLOOD, ED (MC, WL, AP ONLY): I-stat hCG, quantitative: <5 m[IU]/mL (ref <5)

## 2018-10-06 MED ORDER — SODIUM CHLORIDE 0.9% FLUSH: 3.0000 mL | Freq: Once | INTRAVENOUS | Status: DC

## 2018-10-06 NOTE — Discharge Instructions (Addendum)
°  Antiinflammatory medications: Take 600 mg of ibuprofen every 6 hours or 440 mg (over the counter dose) to 500 mg (prescription dose) of naproxen every 12 hours for the next 3 days. After this time, these medications may be used as needed for pain. Take these medications with food to avoid upset stomach. Choose only one of these medications, do not take them together. Acetaminophen (generic for Tylenol): Should you continue to have additional pain while taking the ibuprofen or naproxen, you may add in acetaminophen as needed. Your daily total maximum amount of acetaminophen from all sources should be limited to 4000mg /day for persons without liver problems, or 2000mg /day for those with liver problems. Lidocaine patches: These are available via either prescription or over-the-counter. The over-the-counter option may be more economical one and are likely just as effective. There are multiple over-the-counter brands, such as Salonpas. Follow up: Follow up with a primary care provider for any future management of these complaints. Be sure to follow up within 7-10 days if symptoms fail to resolve. Return: Return to the ED should symptoms worsen.  For prescription assistance, may try using prescription discount sites or apps, such as goodrx.com

## 2018-10-06 NOTE — ED Provider Notes (Signed)
[facility]   CSN: [id number] Arrival date & time: 10/06/18  [id number]     History   Chief Complaint Chief Complaint  Patient presents with  . Chest Pain    HPI Amber Mathews is a 33 y.o. female.     HPI   Amber Mathews is a 33 y.o. female, patient with no pertinent past medical history, presenting to the ED with chest pain for the last couple days.  Pain is along the sternum, described as a soreness, sometimes sharp, only present with opening of the chest, twisting, or lifting.  Sometimes radiates inferiorly along the sternum toward the epigastric region.  Pain resolves as soon as she stops the above movements.  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 She has not tried any therapies for her complaint. Denies family history of SCD or other cardiac abnormalities. Denies history of PE/DVT, recent travel, immobilization, surgeries, trauma. Denies fever/chills, cough, shortness of breath, N/V/D, dizziness, syncope, diaphoresis, back pain, abdominal pain, or any other complaints.        Past Medical History:  Diagnosis Date  . Ectopic pregnancy    Had MTX  . Herpes genitalia    last outbreak 2013    Patient Active Problem List   Diagnosis Date Noted  . Status post vaginal delivery 11/30/2013  . Active labor at term 11/28/2013    Past Surgical History:  Procedure Laterality Date  . NO PAST SURGERIES       OB History    Gravida  5   Para  2   Term  2   Preterm      AB  3   Living  2     SAB      TAB  2   Ectopic  1   Multiple  0   Live Births  2            Home Medications    Prior to Admission medications   Medication Sig Start Date End Date Taking? Authorizing Provider  cyclobenzaprine (FLEXERIL) 10 MG tablet Take 1 tablet (10 mg total) by mouth 2 (two) times daily as needed for muscle spasms. 03/10/17   Elpidio AnisUpstill, Shari, PA-C  ibuprofen (ADVIL,MOTRIN) 600 MG tablet  Take 1 tablet (600 mg total) by mouth every 6 (six) hours as needed. 03/10/17   Elpidio AnisUpstill, Shari, PA-C  metroNIDAZOLE (FLAGYL) 500 MG tablet Take 1 tablet (500 mg total) by mouth 2 (two) times daily. 02/12/18   Donette LarryBhambri, Melanie, CNM    Family History Family History  Problem Relation Age of Onset  . Asthma Daughter   . Cancer Maternal Grandfather        prostate  . Heart disease Paternal Grandfather     Social History Social History   Tobacco Use  . Smoking status: Never Smoker  . Smokeless tobacco: Never Used  Substance Use Topics  . Alcohol use: No  . Drug use: No     Allergies   Patient has no known allergies.   Review of Systems Review of Systems  Constitutional: Negative for chills, diaphoresis and fever.  Respiratory: Negative for cough and shortness of breath.   Gastrointestinal: Negative for abdominal pain, diarrhea, nausea and vomiting.  Musculoskeletal: Negative for back pain.       Sternum pain  Neurological: Negative for dizziness, syncope, weakness and numbness.  All other systems reviewed and are negative.    Physical Exam Updated Vital Signs BP  115/72 (BP Location: Right Arm)   Pulse 73   Temp 98.4 F (36.9 C) (Oral)   Resp 16   Ht 5\' 5"  (1.651 m)   Wt 75.3 kg   LMP 09/20/2018   SpO2 100%   BMI 27.62 kg/m   Physical Exam Vitals signs and nursing note reviewed.  Constitutional:      General: She is not in acute distress.    Appearance: She is well-developed. She is not diaphoretic.  HENT:     Head: Normocephalic and atraumatic.     Mouth/Throat:     Mouth: Mucous membranes are moist.     Pharynx: Oropharynx is clear.  Eyes:     Conjunctiva/sclera: Conjunctivae normal.  Neck:     Musculoskeletal: Neck supple.  Cardiovascular:     Rate and Rhythm: Normal rate and regular rhythm.     Pulses: Normal pulses.          Radial pulses are 2+ on the right side and 2+ on the left side.       Posterior tibial pulses are 2+ on the right side and 2+  on the left side.     Heart sounds: Normal heart sounds.     Comments: Tactile temperature in the extremities appropriate and equal bilaterally. Pulmonary:     Effort: Pulmonary effort is normal. No respiratory distress.     Breath sounds: Normal breath sounds.  Chest:     Chest wall: No deformity, swelling or edema.     Comments: Some pain reproducible with twisting movements of the torso. Abdominal:     Palpations: Abdomen is soft.     Tenderness: There is no abdominal tenderness. There is no guarding.  Musculoskeletal:     Right lower leg: No edema.     Left lower leg: No edema.  Lymphadenopathy:     Cervical: No cervical adenopathy.  Skin:    General: Skin is warm and dry.  Neurological:     Mental Status: She is alert.  Psychiatric:        Mood and Affect: Mood and affect normal.        Speech: Speech normal.        Behavior: Behavior normal.      ED Treatments / Results  Labs (all labs ordered are listed, but only abnormal results are displayed) Labs Reviewed  BASIC METABOLIC PANEL  CBC  I-STAT BETA HCG BLOOD, ED ([facility], [facility], [facility] ONLY)  TROPONIN I (HIGH SENSITIVITY)  TROPONIN I (HIGH SENSITIVITY)    EKG EKG Interpretation  Date/Time:  Thursday October 06 2018 10:11:02 EDT Ventricular Rate:  62 PR Interval:  184 QRS Duration: 94 QT Interval:  380 QTC Calculation: 385 R Axis:   65 Text Interpretation:  Normal sinus rhythm Normal ECG No significant change since last tracing Confirmed by 11-16-2001 [id number]) on 10/06/2018 12:16:38 PM     Radiology Dg Chest 2 View  Result Date: 10/06/2018 CLINICAL DATA:  Chest and abdominal pain for 3 days EXAM: CHEST - 2 VIEW COMPARISON:  None FINDINGS: Normal heart size, mediastinal contours, and pulmonary vascularity. Lungs clear. No pleural effusion or pneumothorax. Bones unremarkable. IMPRESSION: Normal exam. Electronically Signed   By: 12/06/2018 M.D.   On: 10/06/2018 10:30    Procedures Procedures (including critical care  time)  Medications Ordered in ED Medications  sodium chloride flush (NS) 0.9 % injection 3 mL (has no administration in time range)     Initial Impression / Assessment and Plan /  ED Course  I have reviewed the triage vital signs and the nursing notes.  Pertinent labs & imaging results that were available during my care of the patient were reviewed by me and considered in my medical decision making (see chart for details).        Low suspicion for ACS. No high risk/suspicious features; no exertional chest pain, vomiting, diaphoresis, or radiation. HEART score is 0, indicating low risk for a cardiac event.  EKG without evidence of acute ischemia or pathologic/symptomatic arrhythmia. PERC negative.  No mediastinal abnormalities on chest x-ray or abnormalities to suggest hiatal hernia. Musculoskeletal origin is possible based on patient's description, physical exam findings, and history elements. The patient was given instructions for home care as well as return precautions. Patient voices understanding of these instructions, accepts the plan, and is comfortable with discharge.  Final Clinical Impressions(s) / ED Diagnoses   Final diagnoses:  Atypical chest pain    ED Discharge Orders    None       Layla Maw 10/06/18 [place], [place], DO 10/06/18 1249

## 2018-10-06 NOTE — ED Triage Notes (Signed)
Pt reports midsternal CP worse with deep breath. Sharp in nature. Denies recent cough, fever, sick contact. No medical problems. VSS.

## 2018-11-16 ENCOUNTER — Emergency Department (HOSPITAL_COMMUNITY)
Admission: EM | Admit: 2018-11-16 | Discharge: 2018-11-16 | Disposition: A | Discharge status: Home / Self Care | Payer: BC Managed Care – PPO | Attending: Emergency Medicine | Admitting: Emergency Medicine

## 2018-11-16 ENCOUNTER — Telehealth: Payer: Self-pay | Admitting: *Deleted

## 2018-11-16 DIAGNOSIS — M7918 Myalgia, other site: Secondary | ICD-10-CM | POA: Insufficient documentation

## 2018-11-16 DIAGNOSIS — R1084 Generalized abdominal pain: Secondary | ICD-10-CM | POA: Diagnosis not present

## 2018-11-16 DIAGNOSIS — U071 COVID-19: Secondary | ICD-10-CM | POA: Diagnosis not present

## 2018-11-16 DIAGNOSIS — Z20822 Contact with and (suspected) exposure to covid-19: Secondary | ICD-10-CM

## 2018-11-16 DIAGNOSIS — J029 Acute pharyngitis, unspecified: Secondary | ICD-10-CM | POA: Diagnosis present

## 2018-11-16 DIAGNOSIS — N3000 Acute cystitis without hematuria: Secondary | ICD-10-CM | POA: Diagnosis not present

## 2018-11-16 DIAGNOSIS — R509 Fever, unspecified: Secondary | ICD-10-CM | POA: Insufficient documentation

## 2018-11-16 LAB — URINALYSIS, ROUTINE W REFLEX MICROSCOPIC
Bilirubin Urine: NEGATIVE
Glucose, UA: NEGATIVE mg/dL
Hgb urine dipstick: NEGATIVE
Ketones, ur: NEGATIVE mg/dL
Nitrite: NEGATIVE
Protein, ur: NEGATIVE mg/dL
Specific Gravity, Urine: 1.024 (ref 1.005–1.030)
pH: 5 (ref 5.0–8.0)

## 2018-11-16 LAB — GROUP A STREP BY PCR: Group A Strep by PCR: NOT DETECTED

## 2018-11-16 LAB — PREGNANCY, URINE: Preg Test, Ur: NEGATIVE

## 2018-11-16 LAB — SARS CORONAVIRUS 2 (TAT 6-24 HRS): SARS Coronavirus 2: POSITIVE — AB

## 2018-11-16 MED ORDER — CEPHALEXIN 500 MG PO CAPS: 500.0000 mg | ORAL_CAPSULE | Freq: Three times a day (TID) | ORAL | 0 refills | Status: AC

## 2018-11-16 MED ORDER — LIDOCAINE VISCOUS HCL 2 % MT SOLN: 15.0000 mL | OROMUCOSAL | 0 refills | Status: DC | PRN

## 2018-11-16 MED ORDER — ACETAMINOPHEN 325 MG PO TABS: 650.0000 mg | ORAL_TABLET | Freq: Once | ORAL | Status: DC

## 2018-11-16 NOTE — ED Provider Notes (Signed)
[facility] EMERGENCY DEPARTMENT Provider Note   CSN: [id number] Arrival date & time: 11/16/18  5053     History   Chief Complaint Chief Complaint  Patient presents with  . Sore Throat    HPI Amber Mathews is a 33 y.o. female with no significant past medical history who presents to the ED due to sudden onset of sore throat that started yesterday. She rates her pain an 8/10. Sore throat is associated with fever and body aches. T-max 103 last night. Patient has not tried anything for her symptoms at home. Patient also admits to frontal headache, dysuria, and generalized abdominal pain. Denies sick contacts. Denies issues swallowing. Patient denies shortness of breath, cough, chest pain, nausea, vomiting, and diarrhea.  Past Medical History:  Diagnosis Date  . Ectopic pregnancy    Had MTX  . Herpes genitalia    last outbreak 2013    Patient Active Problem List   Diagnosis Date Noted  . Status post vaginal delivery 11/30/2013  . Active labor at term 11/28/2013    Past Surgical History:  Procedure Laterality Date  . NO PAST SURGERIES       OB History    Gravida  5   Para  2   Term  2   Preterm      AB  3   Living  2     SAB      TAB  2   Ectopic  1   Multiple  0   Live Births  2            Home Medications    Prior to Admission medications   Medication Sig Start Date End Date Taking? Authorizing Provider  cyclobenzaprine (FLEXERIL) 10 MG tablet Take 1 tablet (10 mg total) by mouth 2 (two) times daily as needed for muscle spasms. 03/10/17   Charlann Lange, PA-C  ibuprofen (ADVIL,MOTRIN) 600 MG tablet Take 1 tablet (600 mg total) by mouth every 6 (six) hours as needed. 03/10/17   Charlann Lange, PA-C  metroNIDAZOLE (FLAGYL) 500 MG tablet Take 1 tablet (500 mg total) by mouth 2 (two) times daily. 02/12/18   Julianne Handler, CNM    Family History Family History  Problem Relation Age of Onset  . Asthma Daughter   . Cancer  Maternal Grandfather        prostate  . Heart disease Paternal Grandfather     Social History Social History   Tobacco Use  . Smoking status: Never Smoker  . Smokeless tobacco: Never Used  Substance Use Topics  . Alcohol use: No  . Drug use: No     Allergies   Patient has no known allergies.   Review of Systems Review of Systems  Constitutional: Positive for chills and fever.  HENT: Positive for sore throat.   Respiratory: Negative for cough and shortness of breath.   Cardiovascular: Negative for chest pain, palpitations and leg swelling.  Gastrointestinal: Positive for abdominal pain. Negative for diarrhea, nausea and vomiting.  Genitourinary: Positive for dysuria. Negative for vaginal discharge and vaginal pain.  Neurological: Positive for headaches.     Physical Exam Updated Vital Signs BP 129/74 (BP Location: Right Arm)   Pulse 93   Temp 100 F (37.8 C) (Oral)   Resp 16   SpO2 100%   Physical Exam Vitals signs and nursing note reviewed.  Constitutional:      General: She is not in acute distress.    Appearance: She is not  toxic-appearing.  HENT:     Head: Normocephalic.     Mouth/Throat:     Mouth: Mucous membranes are moist.     Pharynx: Posterior oropharyngeal erythema present. No oropharyngeal exudate.     Comments: Mild erythema. Uvula midline. No abscess noted. Tolerating oral secretions without difficulty. Normal phonation.  Eyes:     Pupils: Pupils are equal, round, and reactive to light.  Neck:     Musculoskeletal: Neck supple.  Cardiovascular:     Rate and Rhythm: Normal rate and regular rhythm.     Pulses: Normal pulses.     Heart sounds: Normal heart sounds. No murmur. No friction rub. No gallop.   Pulmonary:     Effort: Pulmonary effort is normal.     Breath sounds: Normal breath sounds.     Comments: Clear to auscultation bilaterally.  Abdominal:     General: Abdomen is flat. Bowel sounds are normal. There is no distension.      Palpations: Abdomen is soft.     Tenderness: There is no abdominal tenderness. There is no right CVA tenderness, left CVA tenderness, guarding or rebound.     Comments: Abdomen soft, non-distended, and non-tender. No peritoneal signs.  Musculoskeletal:     Right lower leg: No edema.     Left lower leg: No edema.     Comments: Able to move all 4 extremities without difficulty  Neurological:     General: No focal deficit present.     Mental Status: She is alert and oriented to person, place, and time.     Cranial Nerves: No cranial nerve deficit.     Motor: No weakness.     Comments: Cranial nerves III-XII intact Follows commands. Moves all extremities 5/5 strength to BUE and BLE Sensation intact throughout all major nerve distributions No slurred speech. No facial droop      ED Treatments / Results  Labs (all labs ordered are listed, but only abnormal results are displayed) Labs Reviewed  GROUP A STREP BY PCR  SARS CORONAVIRUS 2 (TAT 6-24 HRS)    EKG None  Radiology No results found.  Procedures Procedures (including critical care time)  Medications Ordered in ED Medications  acetaminophen (TYLENOL) tablet 650 mg (has no administration in time range)     Initial Impression / Assessment and Plan / ED Course  I have reviewed the triage vital signs and the nursing notes.  Pertinent labs & imaging results that were available during my care of the patient were reviewed by me and considered in my medical decision making (see chart for details).       33 year old female presents with sore throat and fever x 1 day. Vitals reviewed and all within normal limits. Patient is afebrile, not tachycardic, not hypoxic. Patient is in no acute distress and non-toxic appearing. Throat with mild erythema. Uvula midline. No concern for retropharyngeal or peritonsillar abscess. Patient also notes dysuria x 1 day. She took OTC UTI medication without relief. Normal neurological exam. Will  obtain UA and urine pregnancy. Strep and COVID test ordered.  Strep test negative. COVID test pending. UA with trace leukocytes, but nitrite negative. Given patient is symptomatic, will treat with Keflex for UTI. Will send home with viscous lidocaine for sore throat. Suspect viral pharyngitis potentially secondary to COVID infection. Advised patient to quarantine away from people until COVID results are available. Strict ED precautions discussed with patient. Patient states understanding and agrees to plan. Patient discharged home in no acute  distress and vitals within normal limits.       Amber Mathews was evaluated in Emergency Department on 11/16/2018 for the symptoms described in the history of present illness. She was evaluated in the context of the global COVID-19 pandemic, which necessitated consideration that the patient might be at risk for infection with the SARS-CoV-2 virus that causes COVID-19. Institutional protocols and algorithms that pertain to the evaluation of patients at risk for COVID-19 are in a state of rapid change based on information released by regulatory bodies including the CDC and federal and state organizations. These policies and algorithms were followed during the patient's care in the ED.  Final Clinical Impressions(s) / ED Diagnoses   Final diagnoses:  None    ED Discharge Orders    None       Renee Harder, PA-C 11/16/18 1518    Derwood Kaplan, MD 11/20/18 1824

## 2018-11-16 NOTE — Discharge Instructions (Addendum)
I am sending you home with an antibiotic for your UTI and numbing solution for your throat. Finish all your antibiotics and take as prescribed. Your COVID test is still pending. You should find out your results within the next few hours. Quarantine until your find out your results. You may take over the counter Tylenol for your fever. Follow-up with your PCP if your symptoms do not improve within the next few days. Return to the ER with new or worsening symptoms.

## 2018-11-16 NOTE — Telephone Encounter (Signed)
Patient calling to have results for COVID testing done in the hospital released to [facility]. Results released and pt now able to view in MyChart to show employer. No other concerns or questions voiced at this time.

## 2018-11-16 NOTE — ED Provider Notes (Signed)
Patient placed in Quick Look pathway, seen and evaluated   Chief Complaint: Sore throat, fever  HPI:  Onset yesterday, sore throat bilateral burning constant worse with swallowing , no alleviating factors, no radiation. Woke up this morning with fever 103F on home thermometer, no medication pta. Also with generalized body aches.  ROS: +Fever, Sore throat, body aches           - Neck pain, CP, SOB, Cough, ABD pain, N/V/D, trismus, drooling, voice change, swelling.  Physical Exam:   Gen: No distress  Neuro: Awake and Alert  Skin: Warm    Focused Exam: The patient has normal phonation and is in control of secretions. No stridor.  Midline uvula without edema. Soft palate rises symmetrically. Mild posterior oropharynx erythema without tonsillar swelling or exudates. Tongue protrusion is normal, floor of mouth is soft. No trismus. No creptius on neck palpation. No gingival erythema or fluctuance noted. Mucus membranes moist.  Speech is clear and goal oriented, follows commands, Major Cranial nerves without deficit, no facial droop, Normal strength in upper and lower extremities bilaterally including dorsiflexion and plantar flexion, strong and equal grip strength, Sensation normal to light and sharp touch, Moves extremities without ataxia, coordination intact, Neg romberg, no pronator drift, Normal gait. No meningeal signs.  Initiation of care has begun. The patient has been counseled on the process, plan, and necessity for staying for the completion/evaluation, and the remainder of the medical screening examination    Amber Mathews 11/16/18 1027    Varney Biles, MD 11/20/18 [phone]

## 2018-11-16 NOTE — ED Triage Notes (Signed)
Pt here with sore throat, fever to 103, bodyaches. Denies sick contacts.

## 2018-12-14 ENCOUNTER — Other Ambulatory Visit: Payer: Self-pay

## 2018-12-14 ENCOUNTER — Encounter (HOSPITAL_COMMUNITY): Payer: Self-pay | Admitting: Emergency Medicine

## 2018-12-14 ENCOUNTER — Ambulatory Visit (HOSPITAL_COMMUNITY)
Admission: EM | Admit: 2018-12-14 | Discharge: 2018-12-14 | Disposition: A | Discharge status: Home / Self Care | Payer: BC Managed Care – PPO | Attending: Emergency Medicine | Admitting: Emergency Medicine

## 2018-12-14 DIAGNOSIS — R1030 Lower abdominal pain, unspecified: Secondary | ICD-10-CM | POA: Insufficient documentation

## 2018-12-14 DIAGNOSIS — Z113 Encounter for screening for infections with a predominantly sexual mode of transmission: Secondary | ICD-10-CM | POA: Diagnosis not present

## 2018-12-14 DIAGNOSIS — Z202 Contact with and (suspected) exposure to infections with a predominantly sexual mode of transmission: Secondary | ICD-10-CM | POA: Diagnosis not present

## 2018-12-14 LAB — HIV ANTIBODY (ROUTINE TESTING W REFLEX): HIV Screen 4th Generation wRfx: NONREACTIVE

## 2018-12-14 LAB — POCT URINALYSIS DIP (DEVICE)
Bilirubin Urine: NEGATIVE
Glucose, UA: NEGATIVE mg/dL
Hgb urine dipstick: NEGATIVE
Ketones, ur: NEGATIVE mg/dL
Leukocytes,Ua: NEGATIVE
Nitrite: NEGATIVE
Protein, ur: NEGATIVE mg/dL
Specific Gravity, Urine: 1.025 (ref 1.005–1.030)
Urobilinogen, UA: 0.2 mg/dL (ref 0.0–1.0)
pH: 5.5 (ref 5.0–8.0)

## 2018-12-14 NOTE — ED Provider Notes (Addendum)
[facility]    CSN: [id number] Arrival date & time: 12/14/18  1858      History   Chief Complaint Chief Complaint  Patient presents with  . Abdominal Pain    HPI Amber Mathews is a 33 y.o. female.   33 year old female presented to the urgent care for complaint of vaginal discharge, stomach pain x 2 to 3 days.  Her boyfriend was tested and treated for STDs. Unknown which std he was treated for. Denies chills or fever, vaginal itching, burning, flank pain, nausea, vomiting, diarrhea.  She is sexually active with one female partner.  Last menstrual period was 11/08/18  The history is provided by the patient. No language interpreter was used.    Past Medical History:  Diagnosis Date  . Ectopic pregnancy    Had MTX  . Herpes genitalia    last outbreak 2013    Patient Active Problem List   Diagnosis Date Noted  . Status post vaginal delivery 11/30/2013  . Active labor at term 11/28/2013    Past Surgical History:  Procedure Laterality Date  . NO PAST SURGERIES      OB History    Gravida  5   Para  2   Term  2   Preterm      AB  3   Living  2     SAB      TAB  2   Ectopic  1   Multiple  0   Live Births  2            Home Medications    Prior to Admission medications   Medication Sig Start Date End Date Taking? Authorizing Provider  cyclobenzaprine (FLEXERIL) 10 MG tablet Take 1 tablet (10 mg total) by mouth 2 (two) times daily as needed for muscle spasms. 03/10/17   Charlann Lange, PA-C  ibuprofen (ADVIL,MOTRIN) 600 MG tablet Take 1 tablet (600 mg total) by mouth every 6 (six) hours as needed. 03/10/17   Charlann Lange, PA-C  lidocaine (XYLOCAINE) 2 % solution Use as directed 15 mLs in the mouth or throat as needed for mouth pain (as needed for sore throat). 11/16/18   Cheek, Comer Locket, PA-C  metroNIDAZOLE (FLAGYL) 500 MG tablet Take 1 tablet (500 mg total) by mouth 2 (two) times daily. 02/12/18   Julianne Handler, CNM    Family  History Family History  Problem Relation Age of Onset  . Asthma Daughter   . Cancer Maternal Grandfather        prostate  . Heart disease Paternal Grandfather     Social History Social History   Tobacco Use  . Smoking status: Never Smoker  . Smokeless tobacco: Never Used  Substance Use Topics  . Alcohol use: Yes  . Drug use: No     Allergies   Patient has no known allergies.   Review of Systems Review of Systems  Constitutional: Negative.   HENT: Negative.   Respiratory: Negative.   Cardiovascular: Negative.   Gastrointestinal: Positive for abdominal pain.  Genitourinary: Positive for vaginal discharge.  ROS: All other are negatives   Physical Exam Triage Vital Signs ED Triage Vitals  Enc Vitals Group     BP      Pulse      Resp      Temp      Temp src      SpO2      Weight      Height  Head Circumference      Peak Flow      Pain Score      Pain Loc      Pain Edu?      Excl. in GC?    No data found.  Updated Vital Signs BP (!) 116/56 (BP Location: Left Arm)   Pulse 77   Temp 97.6 F (36.4 C) (Oral)   Resp 16   LMP 11/20/2018   SpO2 100%   Visual Acuity Right Eye Distance:   Left Eye Distance:   Bilateral Distance:    Right Eye Near:   Left Eye Near:    Bilateral Near:     Physical Exam Constitutional:      General: She is not in acute distress.    Appearance: She is well-developed. She is not ill-appearing or toxic-appearing.  Cardiovascular:     Rate and Rhythm: Normal rate and regular rhythm.     Pulses: Normal pulses.     Heart sounds: Normal heart sounds. No murmur.  Pulmonary:     Effort: Pulmonary effort is normal. No respiratory distress.     Breath sounds: Normal breath sounds.  Chest:     Chest wall: No tenderness.  Abdominal:     General: Abdomen is flat. Bowel sounds are normal.     Palpations: Abdomen is soft.  Genitourinary:    Pubic Area: No rash.      Labia:        Right: No rash or tenderness.         Left: No rash or tenderness.   Neurological:     Mental Status: She is alert.      UC Treatments / Results  Labs (all labs ordered are listed, but only abnormal results are displayed) Labs Reviewed  URINE CULTURE  RPR  HIV ANTIBODY (ROUTINE TESTING W REFLEX)  CERVICOVAGINAL ANCILLARY ONLY    EKG   Radiology No results found.  Procedures Procedures (including critical care time)  Medications Ordered in UC Medications - No data to display  Initial Impression / Assessment and Plan / UC Course  I have reviewed the triage vital signs and the nursing notes.  Pertinent labs & imaging results that were available during my care of the patient were reviewed by me and considered in my medical decision making (see chart for details).     Patient stable for discharge.  UA is normal.  Benign physical exam.  STD screening completed.  Will call patient when   if result is abnormal. Final Clinical Impressions(s) / UC Diagnoses   Final diagnoses:  Lower abdominal pain  Screen for STD (sexually transmitted disease)     Discharge Instructions     Advised patient to follow-up with primary care Advised patient to practice safe sex Advised patient to return if symptoms get worse    ED Prescriptions    None     PDMP not reviewed this encounter.   Durward Parcel, FNP 12/14/18 2025    Durward Parcel, FNP 12/15/18 6010    Durward Parcel, FNP 12/15/18 9323    Durward Parcel, FNP 12/15/18 [phone]

## 2018-12-14 NOTE — ED Triage Notes (Signed)
Seen by provider prior to this nurse 

## 2018-12-14 NOTE — Discharge Instructions (Addendum)
UA is normal sent for culture  Advised patient to follow-up with primary care Advised patient to practice safe sex Advised patient to return if symptoms get worse

## 2018-12-15 LAB — URINE CULTURE: Culture: NO GROWTH

## 2018-12-15 LAB — RPR: RPR Ser Ql: NONREACTIVE

## 2018-12-19 ENCOUNTER — Telehealth: Payer: Self-pay | Admitting: Emergency Medicine

## 2018-12-19 LAB — CERVICOVAGINAL ANCILLARY ONLY
Bacterial vaginitis: POSITIVE — AB
Candida vaginitis: POSITIVE — AB
Chlamydia: NEGATIVE
Neisseria Gonorrhea: NEGATIVE
Trichomonas: NEGATIVE

## 2018-12-19 MED ORDER — METRONIDAZOLE 500 MG PO TABS: 500.0000 mg | ORAL_TABLET | Freq: Two times a day (BID) | ORAL | 0 refills | Status: AC

## 2018-12-19 MED ORDER — FLUCONAZOLE 150 MG PO TABS: 150.0000 mg | ORAL_TABLET | Freq: Once | ORAL | 0 refills | Status: AC

## 2018-12-19 NOTE — Telephone Encounter (Signed)
Bacterial vaginosis is positive. This was not treated at the urgent care visit.  Flagyl 500 mg BID x 7 days #14 no refills sent to patients pharmacy of choice.    Test for candida (yeast) was positive.  Prescription for fluconazole 150mg po now, repeat dose in 3d if needed, #2 no refills, sent to the pharmacy of record.  Recheck or followup with PCP for further evaluation if symptoms are not improving.    Patient contacted by phone and made aware of    results. Pt verbalized understanding and had all questions answered.    

## 2019-02-04 ENCOUNTER — Encounter (HOSPITAL_COMMUNITY): Payer: Self-pay | Admitting: Obstetrics and Gynecology

## 2019-02-04 ENCOUNTER — Inpatient Hospital Stay (HOSPITAL_COMMUNITY)
Admission: EM | Admit: 2019-02-04 | Discharge: 2019-02-04 | Disposition: A | Discharge status: Home / Self Care | Payer: BC Managed Care – PPO | Attending: Emergency Medicine | Admitting: Emergency Medicine

## 2019-02-04 ENCOUNTER — Inpatient Hospital Stay (HOSPITAL_COMMUNITY): Payer: BC Managed Care – PPO

## 2019-02-04 ENCOUNTER — Other Ambulatory Visit: Payer: Self-pay

## 2019-02-04 DIAGNOSIS — O26891 Other specified pregnancy related conditions, first trimester: Secondary | ICD-10-CM | POA: Insufficient documentation

## 2019-02-04 DIAGNOSIS — O3680X0 Pregnancy with inconclusive fetal viability, not applicable or unspecified: Secondary | ICD-10-CM

## 2019-02-04 DIAGNOSIS — R1084 Generalized abdominal pain: Secondary | ICD-10-CM | POA: Insufficient documentation

## 2019-02-04 DIAGNOSIS — Z3A09 9 weeks gestation of pregnancy: Secondary | ICD-10-CM | POA: Diagnosis not present

## 2019-02-04 DIAGNOSIS — Z3490 Encounter for supervision of normal pregnancy, unspecified, unspecified trimester: Secondary | ICD-10-CM

## 2019-02-04 DIAGNOSIS — R109 Unspecified abdominal pain: Secondary | ICD-10-CM

## 2019-02-04 LAB — WET PREP, GENITAL
Clue Cells Wet Prep HPF POC: NONE SEEN
Sperm: NONE SEEN
Trich, Wet Prep: NONE SEEN
WBC, Wet Prep HPF POC: NONE SEEN
Yeast Wet Prep HPF POC: NONE SEEN

## 2019-02-04 LAB — HCG, QUANTITATIVE, PREGNANCY: hCG, Beta Chain, Quant, S: 10640 m[IU]/mL — ABNORMAL HIGH (ref <5)

## 2019-02-04 LAB — URINALYSIS, ROUTINE W REFLEX MICROSCOPIC
Bilirubin Urine: NEGATIVE
Glucose, UA: NEGATIVE mg/dL
Hgb urine dipstick: NEGATIVE
Ketones, ur: NEGATIVE mg/dL
Leukocytes,Ua: NEGATIVE
Nitrite: NEGATIVE
Protein, ur: NEGATIVE mg/dL
Specific Gravity, Urine: 1.021 (ref 1.005–1.030)
pH: 6 (ref 5.0–8.0)

## 2019-02-04 LAB — POCT PREGNANCY, URINE: Preg Test, Ur: POSITIVE — AB

## 2019-02-04 MED ORDER — ACETAMINOPHEN 500 MG PO TABS
1000.0000 mg | ORAL_TABLET | Freq: Once | ORAL | Status: AC
  Administered 2019-02-04: 1000 mg via ORAL
  Filled 2019-02-04: qty 2

## 2019-02-04 NOTE — Discharge Instructions (Signed)
Human Chorionic Gonadotropin Test Why am I having this test? A human chorionic gonadotropin (hCG) test is done to determine whether you are pregnant. It can also be used:  To diagnose an abnormal pregnancy.  To determine whether you have had a failed pregnancy (miscarriage) or are at risk of one. What is being tested? This test checks the level of the human chorionic gonadotropin (hCG) hormone in the blood. This hormone is produced during pregnancy by the cells that form the placenta. The placenta is the organ that grows inside your womb (uterus) to nourish a developing baby. When you are pregnant, hCG can be detected in your blood or urine 7 to 8 days before your missed period. It continues to go up for the first 8-10 weeks of pregnancy. The presence of hCG in your blood can be measured with several different types of tests. You may have:  A urine test. ? Because this hormone is eliminated from your body by your kidneys, you may have a urine test to find out whether you are pregnant. A home pregnancy test detects whether there is hCG in your urine. ? A urine test only shows whether there is hCG in your urine. It does not measure how much.  A qualitative blood test. ? You may have this type of blood test to find out if you are pregnant. ? This blood test only shows whether there is hCG in your blood. It does not measure how much.  A quantitative blood test. ? This type of blood test measures the amount of hCG in your blood. ? You may have this test to:  Diagnose an abnormal pregnancy.  Check whether you have had a miscarriage.  Determine whether you are at risk of a miscarriage. What kind of sample is taken?     Two kinds of samples may be collected to test for the hCG hormone.  Blood. It is usually collected by inserting a needle into a blood vessel.  Urine. It is usually collected by urinating into a germ-free (sterile) specimen cup. It is best to collect the sample the first  time you urinate in the morning. How do I prepare for this test? No preparation is needed for a blood test.  For the urine test:  Let your health care provider know about: ? All medicines you are taking, including vitamins, herbs, creams, and over-the-counter medicines. ? Any blood in your urine. This may interfere with the result.  Do not drink too much fluid. Drink as you normally would, or as directed by your health care provider. How are the results reported? Depending on the type of test that you have, your test results may be reported as values. Your health care provider will compare your results to normal ranges that were established after testing a large group of people (reference ranges). Reference ranges may vary among labs and hospitals. For this test, common reference ranges that show absence of pregnancy are:  Quantitative hCG blood levels: less than 5 IU/L. Other results will be reported as either positive or negative. For this test, normal results (meaning the absence of pregnancy) are:  Negative for hCG in the urine test.  Negative for hCG in the qualitative blood test. What do the results mean? Urine and qualitative blood test  A negative result could mean: ? That you are not pregnant. ? That the test was done too early in your pregnancy to detect hCG in your blood or urine. If you still have other signs   of pregnancy, the test will be repeated.  A positive result means: ? That you are most likely pregnant. Your health care provider may confirm your pregnancy with an imaging study (ultrasound) of your uterus, if needed. Quantitative blood test Results of the quantitative hCG blood test will be interpreted as follows:  Less than 5 IU/L: You are most likely not pregnant.  Greater than 25 IU/L: You are most likely pregnant.  hCG levels that are higher than expected: ? You are pregnant with twins. ? You have abnormal growths in the uterus.  hCG levels that are  rising more slowly than expected: ? You have an ectopic pregnancy (also called a tubal pregnancy).  hCG levels that are falling: ? You may be having a miscarriage. Talk with your health care provider about what your results mean. Questions to ask your health care provider Ask your health care provider, or the department that is doing the test:  When will my results be ready?  How will I get my results?  What are my treatment options?  What other tests do I need?  What are my next steps? Summary  A human chorionic gonadotropin test is done to determine whether you are pregnant.  When you are pregnant, hCG can be detected in your blood or urine 7 to 8 days before your missed period. It continues to go up for the first 8-10 weeks of pregnancy.  Your hCG level can be measured with different types of tests. You may have a urine test, a qualitative blood test, or a quantitative blood test.  Talk with your health care provider about what your results mean. This information is not intended to replace advice given to you by your health care provider. Make sure you discuss any questions you have with your health care provider. Document Revised: 11/23/2016 Document Reviewed: 11/23/2016 Elsevier Patient Education  2020 Elsevier Inc.  

## 2019-02-04 NOTE — MAU Provider Note (Signed)
History     CSN: [id number]  Arrival date and time: 02/04/19 0442   None     Chief Complaint  Patient presents with  . Possible Pregnancy  . Abdominal Pain   Amber Mathews is a 34 y.o. [id number] at [redacted]w[redacted]d by Definite LMP of 12/14/2018 who receives care at [facility].  She states she was seen in the office on Jan 18, 2019 for confimatory UPT and a pelvic exam, but did not have an ultrasound.  Patient presents today for Abdominal Pain.  She states she has been having pain on and off for a few days, but today it has been consistent. Patient states the pain is in her lower left side.  She rates the pain a 8/10 and denies any worsening or alleviating factors.  She reports a history of ectopic pregnancy with a coinciding IUP that she had a TAB. Patient denies recent sexual activity, issues with urination, vaginal discharge, or vaginal bleeding.  She states she has not taken anything for the pain and initially declines medication.  However, when provider discloses that she would only receive tylenol, patient is agreeable.       OB History    Gravida  6   Para  2   Term  2   Preterm      AB  3   Living  2     SAB      TAB  2   Ectopic  1   Multiple  0   Live Births  2           Past Medical History:  Diagnosis Date  . Ectopic pregnancy    Had MTX  . Herpes genitalia    last outbreak 2013    Past Surgical History:  Procedure Laterality Date  . NO PAST SURGERIES      Family History  Problem Relation Age of Onset  . Asthma Daughter   . Cancer Maternal Grandfather        prostate  . Heart disease Paternal Grandfather     Social History   Tobacco Use  . Smoking status: Never Smoker  . Smokeless tobacco: Never Used  Substance Use Topics  . Alcohol use: Not Currently  . Drug use: No    Allergies: No Known Allergies  Medications Prior to Admission  Medication Sig Dispense Refill Last Dose  . cyclobenzaprine (FLEXERIL) 10 MG tablet Take 1  tablet (10 mg total) by mouth 2 (two) times daily as needed for muscle spasms. 20 tablet 0   . ibuprofen (ADVIL,MOTRIN) 600 MG tablet Take 1 tablet (600 mg total) by mouth every 6 (six) hours as needed. 30 tablet 0   . lidocaine (XYLOCAINE) 2 % solution Use as directed 15 mLs in the mouth or throat as needed for mouth pain (as needed for sore throat). 100 mL 0   . metroNIDAZOLE (FLAGYL) 500 MG tablet Take 1 tablet (500 mg total) by mouth 2 (two) times daily. 14 tablet 0     Review of Systems  Constitutional: Negative for chills and fever.  Respiratory: Negative for cough and shortness of breath.   Gastrointestinal: Positive for abdominal pain. Negative for constipation and diarrhea.  Genitourinary: Negative for difficulty urinating, dysuria, pelvic pain, vaginal bleeding and vaginal discharge.  Neurological: Negative for dizziness, light-headedness and headaches.   Physical Exam   Blood pressure 121/66, pulse 79, temperature 98.4 F (36.9 C), temperature source Oral, resp. rate 18, height 5\' 5"  (1.651 m), weight 74  kg, last menstrual period 12/14/2018, SpO2 100 %, unknown if currently breastfeeding.  Physical Exam  Constitutional: She is oriented to person, place, and time. She appears well-developed and well-nourished.  HENT:  Head: Normocephalic and atraumatic.  Eyes: Conjunctivae are normal.  Cardiovascular: Normal rate, regular rhythm and normal heart sounds.  Respiratory: Effort normal and breath sounds normal.  GI: There is abdominal tenderness in the left upper quadrant and left lower quadrant.  Tenderness starts below rib cage  Genitourinary: Cervix exhibits no motion tenderness.    Vaginal discharge present.     No vaginal bleeding.  No bleeding in the vagina.    Genitourinary Comments: Speculum Exam: -Normal External Genitalia: Non tender, no apparent discharge at introitus.  -Vaginal Vault: Pink mucosa with good rugae. Small amt thin white discharge -wet prep  collected -Cervix:Pink, no lesions, cysts, or polyps.  Appears closed. No active bleeding from os-GC/CT collected -Bimanual Exam:  Difficult to assess uterine size d/t panus.     Musculoskeletal:        General: Normal range of motion.  Neurological: She is alert and oriented to person, place, and time.  Skin: Skin is warm and dry.  Psychiatric: She has a normal mood and affect. Her behavior is normal.    MAU Course  Procedures Results for orders placed or performed during the hospital encounter of 02/04/19 (from the past 24 hour(s))  Pregnancy, urine POC     Status: Abnormal   Collection Time: 02/04/19  5:22 AM  Result Value Ref Range   Preg Test, Ur POSITIVE (A) NEGATIVE  Urinalysis, Routine w reflex microscopic     Status: Abnormal   Collection Time: 02/04/19  5:27 AM  Result Value Ref Range   Color, Urine YELLOW YELLOW   APPearance HAZY (A) CLEAR   Specific Gravity, Urine 1.021 1.005 - 1.030   pH 6.0 5.0 - 8.0   Glucose, UA NEGATIVE NEGATIVE mg/dL   Hgb urine dipstick NEGATIVE NEGATIVE   Bilirubin Urine NEGATIVE NEGATIVE   Ketones, ur NEGATIVE NEGATIVE mg/dL   Protein, ur NEGATIVE NEGATIVE mg/dL   Nitrite NEGATIVE NEGATIVE   Leukocytes,Ua NEGATIVE NEGATIVE  Wet prep, genital     Status: None   Collection Time: 02/04/19  5:50 AM  Result Value Ref Range   Yeast Wet Prep HPF POC NONE SEEN NONE SEEN   Trich, Wet Prep NONE SEEN NONE SEEN   Clue Cells Wet Prep HPF POC NONE SEEN NONE SEEN   WBC, Wet Prep HPF POC NONE SEEN NONE SEEN   Sperm NONE SEEN    US OB LESS THAN 14 WEEKS WITH OB TRANSVAGINAL  Result Date: 02/04/2019 CLINICAL DATA:  Pregnancy of unknown location EXAM: OBSTETRIC <14 WK [place] AND TRANSVAGINAL OB US TECHNIQUE: Both transabdominal and transvaginal ultrasound examinations were performed for complete evaluation of the gestation as well as the maternal uterus, adnexal regions, and pelvic cul-de-sac. Transvaginal technique was performed to assess early pregnancy.  COMPARISON:  None. FINDINGS: Intrauterine gestational sac: Single Yolk sac:  Not Visualized. Embryo:  Not Visualized. Cardiac Activity: Not Visualized. MSD: 14.2 mm   6 w   2 d Subchorionic hemorrhage:  Small Maternal uterus/adnexae: Normal right and left ovaries. Probable corpus luteum within the left ovary. No free fluid within the pelvis. IMPRESSION: Probable early intrauterine gestational sac, but no yolk sac, fetal pole, or cardiac activity yet visualized. Recommend follow-up quantitative B-HCG levels and follow-up US in 14 days to assess viability. This recommendation follows SRU consensus guidelines: Diagnostic Criteria  for Nonviable Pregnancy Early in the First Trimester. Malva Limes Med 2013; 321:2248-25. Electronically Signed   By: Annia Belt M.D.   On: 02/04/2019 06:39    MDM Pelvic Exam; Wet Prep and GC/CT Labs: UA,  Ultrasound Pain Medication Assessment and Plan  34 year old [id number] at 7.3 weeks Abdominal Pain H/O Ectopic Pregnancy  -Reviewed POC with patient. -Exam performed and findings discussed.  -Cultures collected and pending.  -Patient offered and accepts pain medication; tylenol 1 gram now. -Will send for [place] and await results.    Cherre Robins 02/04/2019, 5:36 AM   Reassessment (7:12 AM) IUGS at 6.2 weeks  -[place] returns as above. -Wet prep negative. -Patient reports some improvement in pain. -Patient informed of results. -hCG to be collected. -Informed that provider will release quant to [employer]. -Discussed reasoning for hCG now and in 48 hours. -Option to follow up at [place] office or [facility] in 48 hours; Patient opts to follow up in private office.  -Message left on after hours voicemail, per protocol, for scheduling of office visit on Monday Feb 1st. -Patient without questions or concerns.  -Encouraged to call or return to MAU if symptoms worsen or with the onset of new symptoms. -Discharged to home in stable condition.  Cherre Robins MSN,  CNM Advanced Practice Provider, [facility] [employer] [facility]

## 2019-02-04 NOTE — MAU Note (Signed)
Pt sent from [facility] with complaints of lower abdominal cramping that started several days ago. Pain has been on and off but worse tonight. Pt denies vaginal bleeding or discharge. LMP: 12/14/2018. Was seen by MD at [facility] on 01/18/2019.

## 2019-02-04 NOTE — ED Provider Notes (Signed)
MSE was initiated and I personally evaluated the patient and placed orders (if any) at  4:53 AM on February 04, 2019.   G6P2 (2 Abortion, 1 ectopic) approx [redacted] weeks gestation here with lower abdominal cramping.  Seen at [facility] on 01/18/19 and found to be pregnant.  No [place] yet to confirm IUP. No bleeding or discharge.  No fevers or other infectious symptoms.  The patient appears stable so that the remainder of the MSE may be completed by another provider.   Garlon Hatchet, PA-C 02/04/19 0454    Ward, Layla Maw, DO 02/04/19 [phone]

## 2019-02-06 LAB — GC/CHLAMYDIA PROBE AMP (~~LOC~~) NOT AT ARMC
Chlamydia: NEGATIVE
Comment: NEGATIVE
Comment: NORMAL
Neisseria Gonorrhea: NEGATIVE

## 2019-02-15 ENCOUNTER — Encounter (HOSPITAL_COMMUNITY): Payer: Self-pay | Admitting: Obstetrics and Gynecology

## 2019-02-15 ENCOUNTER — Other Ambulatory Visit: Payer: Self-pay

## 2019-02-15 ENCOUNTER — Inpatient Hospital Stay (HOSPITAL_COMMUNITY)
Admission: AD | Admit: 2019-02-15 | Discharge: 2019-02-15 | Disposition: A | Discharge status: Home / Self Care | Payer: BC Managed Care – PPO | Attending: Obstetrics and Gynecology | Admitting: Obstetrics and Gynecology

## 2019-02-15 ENCOUNTER — Inpatient Hospital Stay (HOSPITAL_COMMUNITY): Payer: BC Managed Care – PPO

## 2019-02-15 DIAGNOSIS — Z3A09 9 weeks gestation of pregnancy: Secondary | ICD-10-CM | POA: Insufficient documentation

## 2019-02-15 DIAGNOSIS — O209 Hemorrhage in early pregnancy, unspecified: Secondary | ICD-10-CM

## 2019-02-15 DIAGNOSIS — O039 Complete or unspecified spontaneous abortion without complication: Secondary | ICD-10-CM | POA: Insufficient documentation

## 2019-02-15 LAB — COMPREHENSIVE METABOLIC PANEL
ALT: 12 U/L (ref 0–44)
AST: 16 U/L (ref 15–41)
Albumin: 4 g/dL (ref 3.5–5.0)
Alkaline Phosphatase: 91 U/L (ref 38–126)
Anion gap: 12 (ref 5–15)
BUN: 5 mg/dL — ABNORMAL LOW (ref 6–20)
CO2: 25 mmol/L (ref 22–32)
Calcium: 9.9 mg/dL (ref 8.9–10.3)
Chloride: 103 mmol/L (ref 98–111)
Creatinine, Ser: 0.82 mg/dL (ref 0.44–1.00)
GFR calc Af Amer: >60 mL/min (ref >60)
GFR calc non Af Amer: >60 mL/min (ref >60)
Glucose, Bld: 93 mg/dL (ref 70–99)
Potassium: 4 mmol/L (ref 3.5–5.1)
Sodium: 140 mmol/L (ref 135–145)
Total Bilirubin: 1.1 mg/dL (ref 0.3–1.2)
Total Protein: 7.2 g/dL (ref 6.5–8.1)

## 2019-02-15 LAB — URINALYSIS, ROUTINE W REFLEX MICROSCOPIC
Bacteria, UA: NONE SEEN
Bilirubin Urine: NEGATIVE
Glucose, UA: NEGATIVE mg/dL
Ketones, ur: 20 mg/dL — AB
Leukocytes,Ua: NEGATIVE
Nitrite: NEGATIVE
Protein, ur: NEGATIVE mg/dL
Specific Gravity, Urine: 1.026 (ref 1.005–1.030)
pH: 6 (ref 5.0–8.0)

## 2019-02-15 LAB — HEMOGLOBIN AND HEMATOCRIT, BLOOD
HCT: 42.1 % (ref 36.0–46.0)
Hemoglobin: 14.1 g/dL (ref 12.0–15.0)

## 2019-02-15 LAB — HCG, QUANTITATIVE, PREGNANCY: hCG, Beta Chain, Quant, S: 12382 m[IU]/mL — ABNORMAL HIGH (ref <5)

## 2019-02-15 MED ORDER — MISOPROSTOL 200 MCG PO TABS: 800.0000 ug | ORAL_TABLET | ORAL | 1 refills | Status: DC | PRN

## 2019-02-15 MED ORDER — OXYCODONE HCL 5 MG PO TABS: 5.0000 mg | ORAL_TABLET | Freq: Four times a day (QID) | ORAL | 0 refills | Status: AC | PRN

## 2019-02-15 NOTE — MAU Provider Note (Signed)
Chief Complaint: Vaginal Bleeding   First Provider Initiated Contact with Patient 02/15/19 1620     SUBJECTIVE HPI: Amber Mathews is a 34 y.o. [id number] at [redacted]w[redacted]d who presents to Maternity Admissions reporting vaginal bleeding. Has had some red spotting on toilet paper for the last 2 days. Not bleeding into pad or passing blood clots. Denies abdominal pain. No recent intercourse.  LMP was 12/9 & had a positive pregnancy test at [facility] ob/gyn on 1/13.  Was seen in MAU a few weeks ago & had an empty IUGS on ultrasound. Was supposed to have a follow up HCG 2 days later in the office but states that appointment was never scheduled.    Past Medical History:  Diagnosis Date  . Ectopic pregnancy    Had MTX  . Herpes genitalia    last outbreak 2013   OB History  Gravida Para Term Preterm AB Living  5 2 2   2 2   SAB TAB Ectopic Multiple Live Births    1 1 0 2    # Outcome Date GA Lbr Len/2nd Weight Sex Delivery Anes PTL Lv  5 Current           4 Term 11/28/13 [redacted]w[redacted]d 02:10 / 00:02 3229 g F Vag-Spont None  LIV  3 Ectopic 2013             Birth Comments: MTX  2 Term 06/08/02 [redacted]w[redacted]d   F Vag-Spont None N LIV  1 TAB             Obstetric Comments  Pregnant with IUP and ectopic.  Had TAB and discovered ectopic after bleeding continued.    Past Surgical History:  Procedure Laterality Date  . NO PAST SURGERIES     Social History   Socioeconomic History  . Marital status: Single    Spouse name: Not on file  . Number of children: Not on file  . Years of education: Not on file  . Highest education level: Not on file  Occupational History  . Not on file  Tobacco Use  . Smoking status: Never Smoker  . Smokeless tobacco: Never Used  Substance and Sexual Activity  . Alcohol use: Not Currently  . Drug use: No  . Sexual activity: Yes    Birth control/protection: None  Other Topics Concern  . Not on file  Social History Narrative  . Not on file   Social Determinants of Health    Financial Resource Strain:   . Difficulty of Paying Living Expenses: Not on file  Food Insecurity:   . Worried About Charity fundraiser in the Last Year: Not on file  . Ran Out of Food in the Last Year: Not on file  Transportation Needs:   . Lack of Transportation (Medical): Not on file  . Lack of Transportation (Non-Medical): Not on file  Physical Activity:   . Days of Exercise per Week: Not on file  . Minutes of Exercise per Session: Not on file  Stress:   . Feeling of Stress : Not on file  Social Connections:   . Frequency of Communication with Friends and Family: Not on file  . Frequency of Social Gatherings with Friends and Family: Not on file  . Attends Religious Services: Not on file  . Active Member of Clubs or Organizations: Not on file  . Attends Archivist Meetings: Not on file  . Marital Status: Not on file  Intimate Partner Violence:   . Fear of  Current or Ex-Partner: Not on file  . Emotionally Abused: Not on file  . Physically Abused: Not on file  . Sexually Abused: Not on file   Family History  Problem Relation Age of Onset  . Asthma Daughter   . Cancer Maternal Grandfather        prostate  . Heart disease Paternal Grandfather    No current facility-administered medications on file prior to encounter.   No current outpatient medications on file prior to encounter.   No Known Allergies  I have reviewed patient's Past Medical Hx, Surgical Hx, Family Hx, Social Hx, medications and allergies.   Review of Systems  Constitutional: Negative.   Gastrointestinal: Negative.   Genitourinary: Positive for vaginal bleeding.    OBJECTIVE Patient Vitals for the past 24 hrs:  BP Temp Temp src Pulse Resp SpO2 Height Weight  02/15/19 1853 128/80 - - 68 - - - -  02/15/19 1426 112/72 98.8 F (37.1 C) Oral 70 16 100 % 5\' 5"  (1.651 m) 72.8 kg   Constitutional: Well-developed, well-nourished female in no acute distress.  Cardiovascular: normal rate &  rhythm, no murmur Respiratory: normal rate and effort. Lung sounds clear throughout GI: Abd soft, non-tender, Pos BS x 4. No guarding or rebound tenderness MS: Extremities nontender, no edema, normal ROM Neurologic: Alert and oriented x 4.   LAB RESULTS Results for orders placed or performed during the hospital encounter of 02/15/19 (from the past 24 hour(s))  Hemoglobin and hematocrit, blood     Status: None   Collection Time: 02/15/19  4:41 PM  Result Value Ref Range   Hemoglobin 14.1 12.0 - 15.0 g/dL   HCT 04/15/19 85.8 - 85.0 %  Comprehensive metabolic panel     Status: Abnormal   Collection Time: 02/15/19  4:41 PM  Result Value Ref Range   Sodium 140 135 - 145 mmol/L   Potassium 4.0 3.5 - 5.1 mmol/L   Chloride 103 98 - 111 mmol/L   CO2 25 22 - 32 mmol/L   Glucose, Bld 93 70 - 99 mg/dL   BUN 5 (L) 6 - 20 mg/dL   Creatinine, Ser 04/15/19 0.44 - 1.00 mg/dL   Calcium 9.9 8.9 - 4.12 mg/dL   Total Protein 7.2 6.5 - 8.1 g/dL   Albumin 4.0 3.5 - 5.0 g/dL   AST 16 15 - 41 U/L   ALT 12 0 - 44 U/L   Alkaline Phosphatase 91 38 - 126 U/L   Total Bilirubin 1.1 0.3 - 1.2 mg/dL   GFR calc non Af Amer >60 >60 mL/min   GFR calc Af Amer >60 >60 mL/min   Anion gap 12 5 - 15  hCG, quantitative, pregnancy     Status: Abnormal   Collection Time: 02/15/19  4:41 PM  Result Value Ref Range   hCG, Beta Chain, Quant, S 12,382 (H) <5 mIU/mL  Urinalysis, Routine w reflex microscopic     Status: Abnormal   Collection Time: 02/15/19  5:03 PM  Result Value Ref Range   Color, Urine YELLOW YELLOW   APPearance CLEAR CLEAR   Specific Gravity, Urine 1.026 1.005 - 1.030   pH 6.0 5.0 - 8.0   Glucose, UA NEGATIVE NEGATIVE mg/dL   Hgb urine dipstick MODERATE (A) NEGATIVE   Bilirubin Urine NEGATIVE NEGATIVE   Ketones, ur 20 (A) NEGATIVE mg/dL   Protein, ur NEGATIVE NEGATIVE mg/dL   Nitrite NEGATIVE NEGATIVE   Leukocytes,Ua NEGATIVE NEGATIVE   RBC / HPF 0-5 0 - 5  RBC/hpf   WBC, UA 0-5 0 - 5 WBC/hpf   Bacteria,  UA NONE SEEN NONE SEEN   Squamous Epithelial / LPF 0-5 0 - 5   Mucus PRESENT     IMAGING US OB Transvaginal  Result Date: 02/15/2019 CLINICAL DATA:  Vaginal bleeding, gravida 5 para 2 abortion 2 EXAM: OBSTETRIC <14 WK ULTRASOUND TECHNIQUE: Transabdominal ultrasound was performed for evaluation of the gestation as well as the maternal uterus and adnexal regions. COMPARISON:  02/04/2019 FINDINGS: Intrauterine gestational sac: Single Yolk sac:  Not Visualized. Embryo:  Not Visualized. MSD:  18 mm   6 w   5 d Subchorionic hemorrhage:  None visualized. Maternal uterus/adnexae: Right ovary measures 3.2 x 1.8 x 1.5 cm. Left ovary measures 3.8 x 2.9 x 2.4 cm with a 2.2 cm probable corpus luteum cyst. Both ovaries demonstrate normal color flow imaging. No free fluid.  No pelvic mass. IMPRESSION: 1. Probable early intrauterine gestational sac corresponding to an estimated age of 6 weeks and 5 days. Previously, estimated age was 6 weeks and 2 days on an ultrasound performed approximately 13 days prior. Yolk sac, fetal pole, cardiac activity not yet visualized. Findings are suspicious but not yet definitive for failed pregnancy. Recommend follow-up US in 10-14 days for definitive diagnosis. This recommendation follows SRU consensus guidelines: Diagnostic Criteria for Nonviable Pregnancy Early in the First Trimester. Malva Limes Med 2013; 468:0321-22. Electronically Signed   By: Sharlet Salina M.D.   On: 02/15/2019 17:46    MAU COURSE Orders Placed This Encounter  Procedures  . US OB Transvaginal  . Hemoglobin and hematocrit, blood  . Comprehensive metabolic panel  . hCG, quantitative, pregnancy  . Urinalysis, Routine w reflex microscopic  . Diet NPO time specified  . Discharge patient   Meds ordered this encounter  Medications  . misoprostol (CYTOTEC) 200 MCG tablet    Sig: Take 4 tablets (800 mcg total) by mouth every other day as needed for up to 2 doses.    Dispense:  4 tablet    Refill:  1    Order  Specific Question:   Supervising Provider    Answer:   Conan Bowens [[id number]]  . oxyCODONE (ROXICODONE) 5 MG immediate release tablet    Sig: Take 1 tablet (5 mg total) by mouth every 6 (six) hours as needed for up to 3 days for breakthrough pain.    Dispense:  12 tablet    Refill:  0    Order Specific Question:   Supervising Provider    Answer:   Conan Bowens [[id number]]    MDM RH positive Bleeding stable Hemoglobin 14.1  Ultrasound shows empty IUGS measuring 18 mm. 12 days ago, ultrasound showed empty IUGS measuring 14 mm. Pt reports sure LMP was 12/9 & had a positive pregnancy test in the office on 1/13. HCG today is 12,000 compared to 10,000 on 1/30.  Reviewed patient, labs, & ultrasound with Dr. Despina Hidden. Failed pregnancy.  Offered expectant management vs medical management with patient. She would like to take cytotec at home.  ASSESSMENT 1. Miscarriage   2. Vaginal bleeding in pregnancy, first trimester     PLAN Discharge home in stable condition. Bleeding & infection precautions Rx cytotec & oxycodone S/w Dr. Ellyn Hack to notify of diagnosis & need for follow up.  Left message with [facility] ob/gyn for a miscarriage f/u in 1 week  Follow-up Information    [facility] Follow up.   Why: the office will contact you  for a follow up appointment in 1 week Contact information: [address]  [address] [place] [address] [phone]        [facility] Follow up.   Specialty: Obstetrics and Gynecology Why: return for worsening symptoms Contact information: [address] [id number] [place] [address] [phone]         Allergies as of 02/15/2019   No Known Allergies     Medication List    STOP taking these medications   cyclobenzaprine 10 MG tablet Commonly known as: FLEXERIL     TAKE these medications   misoprostol 200 MCG tablet Commonly known as: CYTOTEC Take 4 tablets (800 mcg total)  by mouth every other day as needed for up to 2 doses.   oxyCODONE 5 MG immediate release tablet Commonly known as: Roxicodone Take 1 tablet (5 mg total) by mouth every 6 (six) hours as needed for up to 3 days for breakthrough pain.        Judeth Horn, NP 02/15/2019  7:37 PM

## 2019-02-15 NOTE — MAU Note (Signed)
Pt has been spotting for 2 days. Today noticed dk red when wiping. Denies clots. Light cramping.

## 2019-02-15 NOTE — Discharge Instructions (Signed)
FACTS YOU SHOULD KNOW  WHAT IS AN EARLY PREGNANCY FAILURE? Once the egg is fertilized with the sperm and begins to develop, it attaches to the lining of the uterus. This early pregnancy tissue may not develop into an embryo (the beginning stage of a baby). Sometimes an embryo does develop but does not continue to grow. These problems can be seen on ultrasound.   MANAGEMNT OF EARLY PREGNANCY FAILURE: About 4 out of 100 (0.25%) women will have a pregnancy loss in her lifetime.  One in five pregnancies is found to be an early pregnancy failure.  There are 3 ways to care for an early pregnancy failure:   (1) Surgery, (2) Medicine, (3) Waiting for you to pass the pregnancy on your own. The decision as to how to proceed after being diagnosed with and early pregnancy failure is an individual one.  The decision can be made only after appropriate counseling.  You need to weigh the pros and cons of the 3 choices. Then you can make the choice that works for you. SURGERY (D&E) . Procedure over in 1 day . Requires being put to sleep . Bleeding may be light . Possible problems during surgery, including injury to womb(uterus) . Care provider has more control Medicine (CYTOTEC) . The complete procedure may take days to weeks . No Surgery . Bleeding may be heavy at times . There may be drug side effects . Patient has more control Waiting . You may choose to wait, in which case your own body may complete the passing of the abnormal early pregnancy on its own in about 2-4 weeks . Your bleeding may be heavy at times . There is a small possibility that you may need surgery if the bleeding is too much or not all of the pregnancy has passed. CYTOTEC MANAGEMENT Prostaglandins (cytotec) are the most widely used drug for this purpose. They cause the uterus to cramp and contract. You will place the medicine yourself inside your vagina in the privacy of your home. Empting of the uterus should occur within 3 days but  the process may continue for several weeks. The bleeding may seem heavy at times. POSSIBLE SIDE EFFECTS FROM CYTOTEC . Nausea   Vomiting . Diarrhea Fever . Chills  Hot Flashes Side effects  from the process of the early pregnancy failure include: . Cramping  Bleeding . Headaches  Dizziness RISKS: This is a low risk procedure. Less than 1 in 100 women has a complication. An incomplete passage of the early pregnancy may occur. Also, Hemorrhage (heavy bleeding) could happen.  Rarely the pregnancy will not be passed completely. Excessively heavy bleeding may occur.  Your doctor may need to perform surgery to empty the uterus (D&E). Afterwards: Everybody will feel differently after the early pregnancy completion. You may have soreness or cramps for a day or two. You may have soreness or cramps for day or two.  You may have light bleeding for up to 2 weeks. You may be as active as you feel like being. If you have any of the following problems you may call [facility]missions [facility] at [phone]. . If you have pain that does not get better  with pain medication . Bleeding that soaks through 2 thick full-sized sanitary pads in an hour . Cramps that last longer than 2 days . Foul smelling discharge . Fever above 100.4 degrees F Even if you do not have any of these symptoms, you should have a follow-up exam to make sure you   are healing properly. This appointment will be made for you before you leave the hospital. Your next normal period will start again in 4-6 week after the loss. You can get pregnant soon after the loss, so use birth control right away. Finally: Make sure all your questions are answered before during and after any procedure. Follow up with medical care and family planning methods.          Miscarriage A miscarriage is the loss of an unborn baby (fetus) before the 20th week of pregnancy. Most miscarriages happen during the first 3 months of pregnancy. Sometimes, a miscarriage  can happen before a woman knows that she is pregnant. Having a miscarriage can be an emotional experience. If you have had a miscarriage, talk with your health care provider about any questions you may have about miscarrying, the grieving process, and your plans for future pregnancy. What are the causes? A miscarriage may be caused by:  Problems with the genes or chromosomes of the fetus. These problems make it impossible for the baby to develop normally. They are often the result of random errors that occur early in the development of the baby, and are not passed from parent to child (not inherited).  Infection of the cervix or uterus.  Conditions that affect hormone balance in the body.  Problems with the cervix, such as the cervix opening and thinning before pregnancy is at term (cervical insufficiency).  Problems with the uterus. These may include: ? A uterus with an abnormal shape. ? Fibroids in the uterus. ? Congenital abnormalities. These are problems that were present at birth.  Certain medical conditions.  Smoking, drinking alcohol, or using drugs.  Injury (trauma). In many cases, the cause of a miscarriage is not known. What are the signs or symptoms? Symptoms of this condition include:  Vaginal bleeding or spotting, with or without cramps or pain.  Pain or cramping in the abdomen or lower back.  Passing fluid, tissue, or blood clots from the vagina. How is this diagnosed? This condition may be diagnosed based on:  A physical exam.  Ultrasound.  Blood tests.  Urine tests. How is this treated? Treatment for a miscarriage is sometimes not necessary if you naturally pass all the tissue that was in your uterus. If necessary, this condition may be treated with:  Dilation and curettage (D&C). This is a procedure in which the cervix is stretched open and the lining of the uterus (endometrium) is scraped. This is done only if tissue from the fetus or placenta remains in  the body (incomplete miscarriage).  Medicines, such as: ? Antibiotic medicine, to treat infection. ? Medicine to help the body pass any remaining tissue. ? Medicine to reduce (contract) the size of the uterus. These medicines may be given if you have a lot of bleeding. If you have Rh negative blood and your baby was Rh positive, you will need a shot of a medicine called Rh immunoglobulinto protect your future babies from Rh blood problems. "Rh-negative" and "Rh-positive" refer to whether or not the blood has a specific protein found on the surface of red blood cells (Rh factor). Follow these instructions at home: Medicines   Take over-the-counter and prescription medicines only as told by your health care provider.  If you were prescribed antibiotic medicine, take it as told by your health care provider. Do not stop taking the antibiotic even if you start to feel better.  Do not take NSAIDs, such as aspirin and ibuprofen, unless they  are approved by your health care provider. These medicines can cause bleeding. Activity  Rest as directed. Ask your health care provider what activities are safe for you.  Have someone help with home and family responsibilities during this time. General instructions  Keep track of the number of sanitary pads you use each day and how soaked (saturated) they are. Write down this information.  Monitor the amount of tissue or blood clots that you pass from your vagina. Save any large amounts of tissue for your health care provider to examine.  Do not use tampons, douche, or have sex until your health care provider approves.  To help you and your partner with the process of grieving, talk with your health care provider or seek counseling.  When you are ready, meet with your health care provider to discuss any important steps you should take for your health. Also, discuss steps you should take to have a healthy pregnancy in the future.  Keep all follow-up  visits as told by your health care provider. This is important. Where to find more information  The American Congress of Obstetricians and Gynecologists: www.acog.org  [place] Department of Health and Programmer, systems of Women's Health: [place] Contact a health care provider if:  You have a fever or chills.  You have a foul smelling vaginal discharge.  You have more bleeding instead of less. Get help right away if:  You have severe cramps or pain in your back or abdomen.  You pass blood clots or tissue from your vagina that is walnut-sized or larger.  You soak more than 1 regular sanitary pad in an hour.  You become light-headed or weak.  You pass out.  You have feelings of sadness that take over your thoughts, or you have thoughts of hurting yourself. Summary  Most miscarriages happen in the first 3 months of pregnancy. Sometimes miscarriage happens before a woman even knows that she is pregnant.  Follow your health care provider's instruction for home care. Keep all follow-up appointments.  To help you and your partner with the process of grieving, talk with your health care provider or seek counseling. This information is not intended to replace advice given to you by your health care provider. Make sure you discuss any questions you have with your health care provider. Document Revised: 04/15/2018 Document Reviewed: 01/28/2016 Elsevier Patient Education  [place].

## 2019-03-14 ENCOUNTER — Emergency Department (HOSPITAL_COMMUNITY)
Admission: EM | Admit: 2019-03-14 | Discharge: 2019-03-15 | Disposition: A | Discharge status: Home / Self Care | Payer: BC Managed Care – PPO | Attending: Emergency Medicine | Admitting: Emergency Medicine

## 2019-03-14 ENCOUNTER — Other Ambulatory Visit: Payer: Self-pay

## 2019-03-14 DIAGNOSIS — L0231 Cutaneous abscess of buttock: Secondary | ICD-10-CM | POA: Insufficient documentation

## 2019-03-14 DIAGNOSIS — L0291 Cutaneous abscess, unspecified: Secondary | ICD-10-CM

## 2019-03-14 DIAGNOSIS — L03317 Cellulitis of buttock: Secondary | ICD-10-CM | POA: Insufficient documentation

## 2019-03-14 DIAGNOSIS — M545 Low back pain: Secondary | ICD-10-CM | POA: Diagnosis present

## 2019-03-15 ENCOUNTER — Encounter (HOSPITAL_COMMUNITY): Payer: Self-pay | Admitting: Emergency Medicine

## 2019-03-15 ENCOUNTER — Other Ambulatory Visit: Payer: Self-pay

## 2019-03-15 MED ORDER — DOXYCYCLINE HYCLATE 100 MG PO CAPS: 100.0000 mg | ORAL_CAPSULE | Freq: Two times a day (BID) | ORAL | 0 refills | Status: DC

## 2019-03-15 MED ORDER — LIDOCAINE-EPINEPHRINE (PF) 2 %-1:200000 IJ SOLN
10.0000 mL | Freq: Once | INTRAMUSCULAR | Status: AC
  Administered 2019-03-15: 10 mL
  Filled 2019-03-15: qty 20

## 2019-03-15 MED ORDER — DOXYCYCLINE HYCLATE 100 MG PO TABS
100.0000 mg | ORAL_TABLET | Freq: Once | ORAL | Status: AC
  Administered 2019-03-15: 100 mg via ORAL
  Filled 2019-03-15: qty 1

## 2019-03-15 NOTE — ED Triage Notes (Signed)
Pt reports a bug bite on her left side of her bottom X2 days that is causing her great pain.

## 2019-03-15 NOTE — ED Provider Notes (Signed)
[facility]PART[facility] Provider Note   CSN: [id number] Arrival date & time: 03/14/19  2357     History Chief Complaint  Patient presents with  . Insect Bite    Amber Mathews is a 34 y.o. female.  Patient presents to the emergency department with a chief complaint of possible spider bite.  She states that she thinks she was bitten by something.  She complains of a small sore spot on her left buttocks.  She states that it has been gradually worsening over the past 2 days.  She states that it is worsened with palpation and when she sits.  She denies any fevers or chills.  Denies any other associated symptoms.  Denies any treatments prior to arrival.  Denies any medication allergies.  The history is provided by the patient. No language interpreter was used.       Past Medical History:  Diagnosis Date  . Ectopic pregnancy    Had MTX  . Herpes genitalia    last outbreak 2013    Patient Active Problem List   Diagnosis Date Noted  . Status post vaginal delivery 11/30/2013  . Active labor at term 11/28/2013    Past Surgical History:  Procedure Laterality Date  . NO PAST SURGERIES       OB History    Gravida  5   Para  2   Term  2   Preterm      AB  2   Living  2     SAB      TAB  1   Ectopic  1   Multiple  0   Live Births  2        Obstetric Comments  Pregnant with IUP and ectopic.  Had TAB and discovered ectopic after bleeding continued.         Family History  Problem Relation Age of Onset  . Asthma Daughter   . Cancer Maternal Grandfather        prostate  . Heart disease Paternal Grandfather     Social History   Tobacco Use  . Smoking status: Never Smoker  . Smokeless tobacco: Never Used  Substance Use Topics  . Alcohol use: Not Currently  . Drug use: No    Home Medications Prior to Admission medications   Medication Sig Start Date End Date Taking? Authorizing Provider  misoprostol (CYTOTEC) 200  MCG tablet Take 4 tablets (800 mcg total) by mouth every other day as needed for up to 2 doses. 02/15/19   Jorje Guild, NP    Allergies    Patient has no known allergies.  Review of Systems   Review of Systems  Constitutional: Negative for chills and fever.  Skin: Positive for color change and rash. Negative for wound.    Physical Exam Updated Vital Signs BP 120/80 (BP Location: Right Arm)   Pulse 80   Temp 98.6 F (37 C) (Oral)   Resp 16   Ht 5\' 5"  (1.651 m)   Wt 74.4 kg   LMP 12/14/2018 (Exact Date)   SpO2 100%   BMI 27.29 kg/m   Physical Exam Vitals and nursing note reviewed.  Constitutional:      General: She is not in acute distress.    Appearance: She is well-developed.  HENT:     Head: Normocephalic and atraumatic.  Eyes:     Conjunctiva/sclera: Conjunctivae normal.  Cardiovascular:     Rate and Rhythm: Normal rate.  Heart sounds: No murmur.  Pulmonary:     Effort: Pulmonary effort is normal. No respiratory distress.  Abdominal:     General: There is no distension.  Genitourinary:    Comments: Chaperone present for procedure and exam 1 x 1 cm area of induration concerning for abscess, with 4 x 4 centimeter area of surrounding mild erythema Musculoskeletal:     Cervical back: Neck supple.     Comments: Moves all extremities  Skin:    General: Skin is warm and dry.  Neurological:     Mental Status: She is alert and oriented to person, place, and time.  Psychiatric:        Mood and Affect: Mood normal.        Behavior: Behavior normal.     ED Results / Procedures / Treatments   Labs (all labs ordered are listed, but only abnormal results are displayed) Labs Reviewed - No data to display  EKG None  Radiology No results found.  Procedures Procedures (including critical care time)  Medications Ordered in ED Medications  lidocaine-EPINEPHrine (XYLOCAINE W/EPI) 2 %-1:200000 (PF) injection 10 mL (has no administration in time range)     ED Course  I have reviewed the triage vital signs and the nursing notes.  Pertinent labs & imaging results that were available during my care of the patient were reviewed by me and considered in my medical decision making (see chart for details).    MDM Rules/Calculators/A&P                      Patient with probable developing abscess on left buttocks.  Will incise and drain in the ED.  She will need antibiotic therapy due to the cellulitic changes surrounding the site. Final Clinical Impression(s) / ED Diagnoses Final diagnoses:  Cellulitis of buttock  Abscess    Rx / DC Orders ED Discharge Orders         Ordered    doxycycline (VIBRAMYCIN) 100 MG capsule  2 times daily     03/15/19 0203           Roxy Horseman, PA-C 03/15/19 0205    Horton, Mayer Masker, MD 03/15/19 [phone]

## 2019-03-15 NOTE — Discharge Instructions (Signed)
The packing material can come out in 2 days.  Try to leave it in until then, but if it falls out before, it is okay.  Please take antibiotics as directed.  Do not take antibiotics if you are pregnant or breast-feeding.  If your symptoms change or worsen, please return to the emergency department.  After the packing material falls out, the wound will heal over the next few days.  You should notice significant reduction in your pain over the next 24 to 48 hours.  You can take Tylenol or Motrin for pain.  Please use warm compresses, such as a warm wash rag several times a day for 20 minutes.

## 2019-03-28 ENCOUNTER — Emergency Department (HOSPITAL_BASED_OUTPATIENT_CLINIC_OR_DEPARTMENT_OTHER)
Admission: EM | Admit: 2019-03-28 | Discharge: 2019-03-28 | Disposition: A | Discharge status: Home / Self Care | Payer: BC Managed Care – PPO | Attending: Emergency Medicine | Admitting: Emergency Medicine

## 2019-03-28 ENCOUNTER — Emergency Department (HOSPITAL_BASED_OUTPATIENT_CLINIC_OR_DEPARTMENT_OTHER): Payer: BC Managed Care – PPO

## 2019-03-28 ENCOUNTER — Other Ambulatory Visit: Payer: Self-pay

## 2019-03-28 ENCOUNTER — Encounter (HOSPITAL_BASED_OUTPATIENT_CLINIC_OR_DEPARTMENT_OTHER): Payer: Self-pay | Admitting: *Deleted

## 2019-03-28 DIAGNOSIS — S0083XA Contusion of other part of head, initial encounter: Secondary | ICD-10-CM | POA: Diagnosis not present

## 2019-03-28 DIAGNOSIS — Y999 Unspecified external cause status: Secondary | ICD-10-CM | POA: Insufficient documentation

## 2019-03-28 DIAGNOSIS — Y92009 Unspecified place in unspecified non-institutional (private) residence as the place of occurrence of the external cause: Secondary | ICD-10-CM | POA: Insufficient documentation

## 2019-03-28 DIAGNOSIS — Y939 Activity, unspecified: Secondary | ICD-10-CM | POA: Diagnosis not present

## 2019-03-28 MED ORDER — ACETAMINOPHEN 325 MG PO TABS
650.0000 mg | ORAL_TABLET | Freq: Once | ORAL | Status: AC
  Administered 2019-03-28: 650 mg via ORAL
  Filled 2019-03-28: qty 2

## 2019-03-28 NOTE — ED Notes (Signed)
Patient transported to CT, Family at bedside.
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
## 2019-03-28 NOTE — Discharge Instructions (Signed)
Apply ice daily for swelling and pain.  Sleep at an upright incline to avoid worsening swelling.  Take Tylenol and/or Motrin for pain and swelling. Thank you for allowing me to care for you today. Please return to the emergency department if you have new or worsening symptoms. Take your medications as instructed.
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
## 2019-04-10 ENCOUNTER — Ambulatory Visit: Payer: BC Managed Care – PPO | Admitting: Podiatry

## 2019-04-20 ENCOUNTER — Other Ambulatory Visit: Payer: Self-pay

## 2019-04-20 ENCOUNTER — Emergency Department (HOSPITAL_BASED_OUTPATIENT_CLINIC_OR_DEPARTMENT_OTHER)
Admission: EM | Admit: 2019-04-20 | Discharge: 2019-04-20 | Disposition: A | Discharge status: Home / Self Care | Payer: BC Managed Care – PPO | Attending: Emergency Medicine | Admitting: Emergency Medicine

## 2019-04-20 ENCOUNTER — Encounter (HOSPITAL_BASED_OUTPATIENT_CLINIC_OR_DEPARTMENT_OTHER): Payer: Self-pay | Admitting: *Deleted

## 2019-04-20 DIAGNOSIS — N760 Acute vaginitis: Secondary | ICD-10-CM | POA: Insufficient documentation

## 2019-04-20 DIAGNOSIS — N898 Other specified noninflammatory disorders of vagina: Secondary | ICD-10-CM | POA: Diagnosis present

## 2019-04-20 DIAGNOSIS — B9689 Other specified bacterial agents as the cause of diseases classified elsewhere: Secondary | ICD-10-CM

## 2019-04-20 LAB — URINALYSIS, ROUTINE W REFLEX MICROSCOPIC
Bilirubin Urine: NEGATIVE
Glucose, UA: NEGATIVE mg/dL
Hgb urine dipstick: NEGATIVE
Ketones, ur: NEGATIVE mg/dL
Nitrite: NEGATIVE
Protein, ur: NEGATIVE mg/dL
Specific Gravity, Urine: 1.01 (ref 1.005–1.030)
pH: 6.5 (ref 5.0–8.0)

## 2019-04-20 LAB — URINALYSIS, MICROSCOPIC (REFLEX)

## 2019-04-20 LAB — WET PREP, GENITAL
Sperm: NONE SEEN
Trich, Wet Prep: NONE SEEN
Yeast Wet Prep HPF POC: NONE SEEN

## 2019-04-20 LAB — PREGNANCY, URINE: Preg Test, Ur: NEGATIVE

## 2019-04-20 MED ORDER — METRONIDAZOLE 500 MG PO TABS
500.0000 mg | ORAL_TABLET | Freq: Once | ORAL | Status: AC
  Administered 2019-04-20: 500 mg via ORAL
  Filled 2019-04-20: qty 1

## 2019-04-20 MED ORDER — CEFTRIAXONE SODIUM 500 MG IJ SOLR
500.0000 mg | Freq: Once | INTRAMUSCULAR | Status: AC
  Administered 2019-04-20: 500 mg via INTRAMUSCULAR
  Filled 2019-04-20: qty 500

## 2019-04-20 MED ORDER — METRONIDAZOLE 500 MG PO TABS: 500.0000 mg | ORAL_TABLET | Freq: Two times a day (BID) | ORAL | 0 refills | Status: DC

## 2019-04-20 MED ORDER — AZITHROMYCIN 250 MG PO TABS
1000.0000 mg | ORAL_TABLET | Freq: Once | ORAL | Status: AC
  Administered 2019-04-20: 1000 mg via ORAL
  Filled 2019-04-20: qty 4

## 2019-04-20 NOTE — ED Triage Notes (Signed)
Lower abdominal pain and vaginal discharge x 2 days.

## 2019-04-20 NOTE — ED Provider Notes (Signed)
[facility] EMERGENCY DEPARTMENT Provider Note   CSN: [id number] Arrival date & time: 04/20/19  1620     History Chief Complaint  Patient presents with  . Abdominal Pain  . Vaginal Discharge    Amber Mathews is a 34 y.o. female.  Patient with a complaint of vaginal discharge and some lower abdominal pain for about 2 days.  Patient does have some concerns about STD exposure.  No fevers.  No nausea or vomiting.  States the discharge is kind of whitish in nature.  But does not feel like a yeast infection        Past Medical History:  Diagnosis Date  . Ectopic pregnancy    Had MTX  . Herpes genitalia    last outbreak 2013    Patient Active Problem List   Diagnosis Date Noted  . Status post vaginal delivery 11/30/2013  . Active labor at term 11/28/2013    Past Surgical History:  Procedure Laterality Date  . NO PAST SURGERIES       OB History    Gravida  5   Para  2   Term  2   Preterm      AB  2   Living  2     SAB      TAB  1   Ectopic  1   Multiple  0   Live Births  2        Obstetric Comments  Pregnant with IUP and ectopic.  Had TAB and discovered ectopic after bleeding continued.         Family History  Problem Relation Age of Onset  . Asthma Daughter   . Cancer Maternal Grandfather        prostate  . Heart disease Paternal Grandfather     Social History   Tobacco Use  . Smoking status: Never Smoker  . Smokeless tobacco: Never Used  Substance Use Topics  . Alcohol use: Not Currently  . Drug use: No    Home Medications Prior to Admission medications   Medication Sig Start Date End Date Taking? Authorizing Provider  doxycycline (VIBRAMYCIN) 100 MG capsule Take 1 capsule (100 mg total) by mouth 2 (two) times daily. 03/15/19   Roxy Horseman, PA-C  metroNIDAZOLE (FLAGYL) 500 MG tablet Take 1 tablet (500 mg total) by mouth 2 (two) times daily. 04/20/19   Vanetta Mulders, MD  misoprostol (CYTOTEC) 200 MCG  tablet Take 4 tablets (800 mcg total) by mouth every other day as needed for up to 2 doses. 02/15/19   Judeth Horn, NP    Allergies    Patient has no known allergies.  Review of Systems   Review of Systems  Constitutional: Negative for chills and fever.  HENT: Negative for rhinorrhea and sore throat.   Eyes: Negative for visual disturbance.  Respiratory: Negative for cough and shortness of breath.   Cardiovascular: Negative for chest pain and leg swelling.  Gastrointestinal: Positive for abdominal pain. Negative for diarrhea, nausea and vomiting.  Genitourinary: Positive for vaginal discharge. Negative for dysuria.  Musculoskeletal: Negative for back pain and neck pain.  Skin: Negative for rash.  Neurological: Negative for dizziness, light-headedness and headaches.  Hematological: Does not bruise/bleed easily.  Psychiatric/Behavioral: Negative for confusion.    Physical Exam Updated Vital Signs BP 118/81 (BP Location: Right Arm)   Pulse 77   Temp 98.5 F (36.9 C) (Oral)   Resp 18   Ht 1.651 m (5\' 5" )   Wt 68  kg   LMP 02/06/2019   SpO2 100%   BMI 24.95 kg/m   Physical Exam Vitals and nursing note reviewed.  Constitutional:      General: She is not in acute distress.    Appearance: Normal appearance. She is well-developed.  HENT:     Head: Normocephalic and atraumatic.  Eyes:     Extraocular Movements: Extraocular movements intact.     Conjunctiva/sclera: Conjunctivae normal.     Pupils: Pupils are equal, round, and reactive to light.  Cardiovascular:     Rate and Rhythm: Normal rate and regular rhythm.     Heart sounds: No murmur.  Pulmonary:     Effort: Pulmonary effort is normal. No respiratory distress.     Breath sounds: Normal breath sounds.  Abdominal:     Palpations: Abdomen is soft.     Tenderness: There is no abdominal tenderness.     Comments: Abdomen completely nontender.  Genitourinary:    General: Normal vulva.     Comments: External genitalia  normal.  Vaginal vault with a whitish discharge.  No cervical motion tenderness.  No lesions.  No adnexal tenderness.  No enlargement of the uterus. Musculoskeletal:        General: No swelling.     Cervical back: Neck supple.  Skin:    General: Skin is warm and dry.  Neurological:     General: No focal deficit present.     Mental Status: She is alert and oriented to person, place, and time.     ED Results / Procedures / Treatments   Labs (all labs ordered are listed, but only abnormal results are displayed) Labs Reviewed  WET PREP, GENITAL - Abnormal; Notable for the following components:      Result Value   Clue Cells Wet Prep HPF POC PRESENT (*)    WBC, Wet Prep HPF POC FEW (*)    All other components within normal limits  URINALYSIS, ROUTINE W REFLEX MICROSCOPIC - Abnormal; Notable for the following components:   APPearance HAZY (*)    Leukocytes,Ua SMALL (*)    All other components within normal limits  URINALYSIS, MICROSCOPIC (REFLEX) - Abnormal; Notable for the following components:   Bacteria, UA RARE (*)    All other components within normal limits  PREGNANCY, URINE  RPR  HIV ANTIBODY (ROUTINE TESTING W REFLEX)  GC/CHLAMYDIA PROBE AMP ([place]) NOT AT [facility]    EKG None  Radiology No results found.  Procedures Procedures (including critical care time)  Medications Ordered in ED Medications  cefTRIAXone (ROCEPHIN) injection 500 mg (has no administration in time range)  azithromycin (ZITHROMAX) tablet 1,000 mg (has no administration in time range)  metroNIDAZOLE (FLAGYL) tablet 500 mg (has no administration in time range)    ED Course  I have reviewed the triage vital signs and the nursing notes.  Pertinent labs & imaging results that were available during my care of the patient were reviewed by me and considered in my medical decision making (see chart for details).    MDM Rules/Calculators/A&P                      Wet prep consistent with  bacterial vaginosis.  Urine pregnancy test negative.  Urinalysis not consistent with urinary tract infection.  No evidence of pelvic inflammatory disease on exam no uterine or adnexal tenderness.  Patient will be treated for STD possibility with Rocephin and Zithromax and then will continue Flagyl at home for the bacterial  vaginosis.   Final Clinical Impression(s) / ED Diagnoses Final diagnoses:  Vaginal discharge  BV (bacterial vaginosis)    Rx / DC Orders ED Discharge Orders         Ordered    metroNIDAZOLE (FLAGYL) 500 MG tablet  2 times daily     04/20/19 2042           Vanetta Mulders, MD 04/20/19 2048

## 2019-04-20 NOTE — Discharge Instructions (Addendum)
Continue take the Flagyl medication for the next 7 days.  Otherwise the antibiotics that you received here should cover other things.  Return for any new or worse symptoms.  Follow-up if the discharge does not resolve.

## 2019-04-21 LAB — HIV ANTIBODY (ROUTINE TESTING W REFLEX): HIV Screen 4th Generation wRfx: NONREACTIVE

## 2019-04-21 LAB — GC/CHLAMYDIA PROBE AMP (~~LOC~~) NOT AT ARMC
Chlamydia: NEGATIVE
Comment: NEGATIVE
Comment: NORMAL
Neisseria Gonorrhea: NEGATIVE

## 2019-04-21 LAB — RPR: RPR Ser Ql: NONREACTIVE

## 2019-04-27 ENCOUNTER — Encounter: Payer: Self-pay | Admitting: Podiatry

## 2019-04-27 ENCOUNTER — Ambulatory Visit (INDEPENDENT_AMBULATORY_CARE_PROVIDER_SITE_OTHER): Payer: BC Managed Care – PPO | Admitting: Podiatry

## 2019-04-27 ENCOUNTER — Other Ambulatory Visit: Payer: Self-pay

## 2019-04-27 VITALS — BP 110/66 | HR 88 | Temp 96.1°F

## 2019-04-27 DIAGNOSIS — B351 Tinea unguium: Secondary | ICD-10-CM

## 2019-04-27 DIAGNOSIS — Z79899 Other long term (current) drug therapy: Secondary | ICD-10-CM | POA: Diagnosis not present

## 2019-04-27 NOTE — Patient Instructions (Signed)
Terbinafine oral granules What is this medicine? TERBINAFINE (TER bin a feen) is an antifungal medicine. It is used to treat certain kinds of fungal or yeast infections. This medicine may be used for other purposes; ask your health care provider or pharmacist if you have questions. COMMON BRAND NAME(S): Lamisil What should I tell my health care provider before I take this medicine? They need to know if you have any of these conditions:  drink alcoholic beverages  kidney disease  liver disease  an unusual or allergic reaction to Terbinafine, other medicines, foods, dyes, or preservatives  pregnant or trying to get pregnant  breast-feeding How should I use this medicine? Take this medicine by mouth. Follow the directions on the prescription label. Hold packet with cut line on top. Shake packet gently to settle contents. Tear packet open along cut line, or use scissors to cut across line. Carefully pour the entire contents of packet onto a spoonful of a soft food, such as pudding or other soft, non-acidic food such as mashed potatoes (do NOT use applesauce or a fruit-based food). If two packets are required for each dose, you may either sprinkle the content of both packets on one spoonful of non-acidic food, or sprinkle the contents of both packets on two spoonfuls of non-acidic food. Make sure that no granules remain in the packet. Swallow the mxiture of the food and granules without chewing. Take your medicine at regular intervals. Do not take it more often than directed. Take all of your medicine as directed even if you think you are better. Do not skip doses or stop your medicine early. Contact your pediatrician or health care professional regarding the use of this medicine in children. While this medicine may be prescribed for children as young as 4 years for selected conditions, precautions do apply. Overdosage: If you think you have taken too much of this medicine contact a poison control  center or emergency room at once. NOTE: This medicine is only for you. Do not share this medicine with others. What if I miss a dose? If you miss a dose, take it as soon as you can. If it is almost time for your next dose, take only that dose. Do not take double or extra doses. What may interact with this medicine? Do not take this medicine with any of the following medications:  thioridazine This medicine may also interact with the following medications:  beta-blockers  caffeine  cimetidine  cyclosporine  MAOIs like Carbex, Eldepryl, Marplan, Nardil, and Parnate  medicines for fungal infections like fluconazole and ketoconazole  medicines for irregular heartbeat like amiodarone, flecainide and propafenone  rifampin  SSRIs like citalopram, escitalopram, fluoxetine, fluvoxamine, paroxetine and sertraline  tricyclic antidepressants like amitriptyline, clomipramine, desipramine, imipramine, nortriptyline, and others  warfarin This list may not describe all possible interactions. Give your health care provider a list of all the medicines, herbs, non-prescription drugs, or dietary supplements you use. Also tell them if you smoke, drink alcohol, or use illegal drugs. Some items may interact with your medicine. What should I watch for while using this medicine? Your doctor may monitor your liver function. Tell your doctor right away if you have nausea or vomiting, loss of appetite, stomach pain on your right upper side, yellow skin, dark urine, light stools, or are over tired. This medicine may cause serious skin reactions. They can happen weeks to months after starting the medicine. Contact your health care provider right away if you notice fevers or flu-like symptoms   with a rash. The rash may be red or purple and then turn into blisters or peeling of the skin. Or, you might notice a red rash with swelling of the face, lips or lymph nodes in your neck or under your arms. You need to take  this medicine for 6 weeks or longer to cure the fungal infection. Take your medicine regularly for as long as your doctor or health care provider tells you to. What side effects may I notice from receiving this medicine? Side effects that you should report to your doctor or health care professional as soon as possible:  allergic reactions like skin rash or hives, swelling of the face, lips, or tongue  change in vision  dark urine  fever or infection  general ill feeling or flu-like symptoms  light-colored stools  loss of appetite, nausea  rash, fever, and swollen lymph nodes  redness, blistering, peeling or loosening of the skin, including inside the mouth  right upper belly pain  unusually weak or tired  yellowing of the eyes or skin Side effects that usually do not require medical attention (report to your doctor or health care professional if they continue or are bothersome):  changes in taste  diarrhea  hair loss  muscle or joint pain  stomach upset This list may not describe all possible side effects. Call your doctor for medical advice about side effects. You may report side effects to FDA at 1-800-FDA-1088. Where should I keep my medicine? Keep out of the reach of children. Store at room temperature between 15 and 30 degrees C (59 and 86 degrees F). Throw away any unused medicine after the expiration date. NOTE: This sheet is a summary. It may not cover all possible information. If you have questions about this medicine, talk to your doctor, pharmacist, or health care provider.  2020 Elsevier/Gold Standard (2018-04-01 15:35:11)  

## 2019-05-09 DIAGNOSIS — B351 Tinea unguium: Secondary | ICD-10-CM | POA: Insufficient documentation

## 2019-05-09 LAB — HEPATIC FUNCTION PANEL
AG Ratio: 1.8 (calc) (ref 1.0–2.5)
ALT: 11 U/L (ref 6–29)
AST: 16 U/L (ref 10–30)
Albumin: 4.1 g/dL (ref 3.6–5.1)
Alkaline phosphatase (APISO): 88 U/L (ref 31–125)
Bilirubin, Direct: 0.2 mg/dL (ref 0.0–0.2)
Globulin: 2.3 g/dL (calc) (ref 1.9–3.7)
Indirect Bilirubin: 0.7 mg/dL (calc) (ref 0.2–1.2)
Total Bilirubin: 0.9 mg/dL (ref 0.2–1.2)
Total Protein: 6.4 g/dL (ref 6.1–8.1)

## 2019-05-09 LAB — CBC WITH DIFFERENTIAL/PLATELET
Absolute Monocytes: 437 cells/uL (ref 200–950)
Basophils Absolute: 31 cells/uL (ref 0–200)
Basophils Relative: 0.6 %
Eosinophils Absolute: 52 cells/uL (ref 15–500)
Eosinophils Relative: 1 %
HCT: 41.4 % (ref 35.0–45.0)
Hemoglobin: 13.7 g/dL (ref 11.7–15.5)
Lymphs Abs: 1997 cells/uL (ref 850–3900)
MCH: 29.7 pg (ref 27.0–33.0)
MCHC: 33.1 g/dL (ref 32.0–36.0)
MCV: 89.8 fL (ref 80.0–100.0)
MPV: 10.2 fL (ref 7.5–12.5)
Monocytes Relative: 8.4 %
Neutro Abs: 2683 cells/uL (ref 1500–7800)
Neutrophils Relative %: 51.6 %
Platelets: 274 10*3/uL (ref 140–400)
RBC: 4.61 10*6/uL (ref 3.80–5.10)
RDW: 12.5 % (ref 11.0–15.0)
Total Lymphocyte: 38.4 %
WBC: 5.2 10*3/uL (ref 3.8–10.8)

## 2019-05-09 NOTE — Progress Notes (Signed)
Subjective:   Patient ID: Mickel Fuchs, female   DOB: 34 y.o.   MRN: [id number]   HPI 34 year old female presents the office today for concerns of thick, discolored toenails.  This is been ongoing for about 1 year she tried over-the-counter treatment but any significant improvement.  No pain with ADLs.  No redness or drainage or signs of infection reported.  No other concerns.   Review of Systems  All other systems reviewed and are negative.  Past Medical History:  Diagnosis Date  . Ectopic pregnancy    Had MTX  . Herpes genitalia    last outbreak 2013    Past Surgical History:  Procedure Laterality Date  . NO PAST SURGERIES       Current Outpatient Medications:  .  doxycycline (VIBRAMYCIN) 100 MG capsule, Take 1 capsule (100 mg total) by mouth 2 (two) times daily., Disp: 20 capsule, Rfl: 0 .  fluconazole (DIFLUCAN) 150 MG tablet, TAKE 1 TABLET (150 MG TOTAL) BY MOUTH ONCE FOR 1 DOSE. REPEAT IN 3 DAYS IF NEEDED., Disp: , Rfl:  .  medroxyPROGESTERone (DEPO-PROVERA) 150 MG/ML injection, Inject 150 mg into the muscle every 3 (three) months., Disp: , Rfl:  .  metroNIDAZOLE (FLAGYL) 500 MG tablet, Take 1 tablet (500 mg total) by mouth 2 (two) times daily., Disp: 14 tablet, Rfl: 0 .  misoprostol (CYTOTEC) 200 MCG tablet, Take 4 tablets (800 mcg total) by mouth every other day as needed for up to 2 doses., Disp: 4 tablet, Rfl: 1  No Known Allergies        Objective:  Physical Exam  General: AAO x3, NAD  Dermatological: Nails are hypertrophic, dystrophic with brown discoloration there is no hyperpigmentation or Discoloration to the skin.  There is no pain in the nails there is no redness or drainage or any signs of infection.  Vascular: Dorsalis Pedis artery and Posterior Tibial artery pedal pulses are 2/4 bilateral with immedate capillary fill time. Pedal hair growth present. No varicosities and no lower extremity edema present bilateral. There is no pain with calf  compression, swelling, warmth, erythema.   Neruologic: Grossly intact via light touch bilateral.   Musculoskeletal: No gross boney pedal deformities bilateral. No pain, crepitus, or limitation noted with foot and ankle range of motion bilateral. Muscular strength 5/5 in all groups tested bilateral.  Gait: Unassisted, Nonantalgic.      Assessment:   Onychomycosis     Plan:  -Treatment options discussed including all alternatives, risks, and complications -Etiology of symptoms were discussed -We discussed various treatment options for nail fungus.  After discussion she wants to pursue oral Lamisil.  Discussed side effects.  We will check a CBC and LFT.  Follow-up in 6 weeks after initiation of treatment or sooner if needed  Vivi Barrack DPM

## 2019-05-12 ENCOUNTER — Telehealth: Payer: Self-pay | Admitting: *Deleted

## 2019-05-12 MED ORDER — TERBINAFINE HCL 250 MG PO TABS: 250.0000 mg | ORAL_TABLET | Freq: Every day | ORAL | 0 refills | Status: DC

## 2019-05-12 NOTE — Telephone Encounter (Signed)
-----   Message from Vivi Barrack, DPM sent at 05/10/2019  2:30 PM EDT ----- Val- please let her know that the blood work is normal; Please send in Lamisil for 90 days and have her follow up in 6 weeks. Thanks.

## 2019-05-12 NOTE — Telephone Encounter (Signed)
I spoke with pt and informed of results and orders.

## 2019-07-20 ENCOUNTER — Ambulatory Visit: Payer: BC Managed Care – PPO | Admitting: Podiatry

## 2019-07-21 ENCOUNTER — Ambulatory Visit (INDEPENDENT_AMBULATORY_CARE_PROVIDER_SITE_OTHER): Payer: BC Managed Care – PPO | Admitting: Podiatry

## 2019-07-21 DIAGNOSIS — B351 Tinea unguium: Secondary | ICD-10-CM | POA: Diagnosis not present

## 2019-07-23 NOTE — Progress Notes (Signed)
Subjective: 34 year old female presents the office today for evaluation of nail fungus.  She finished the course of Lamisil and she states that both of her toenails are still discolored and somewhat thick on both big toes.  Lamisil could help some but it does continue.  Denies any pain, redness or drainage. Denies any systemic complaints such as fevers, chills, nausea, vomiting. No acute changes since last appointment, and no other complaints at this time.   Objective: AAO x3, NAD DP/PT pulses palpable bilaterally, CRT less than 3 seconds Bilateral hallux nails are mildly hypertrophic, dystrophic with yellow-brown discoloration.  On the left side there was some dark discoloration of the nail but this appears to be more from subungual hematoma.  I debrided the nails able to remove some of the dried blood.  There is no extension of any hyperpigmentation of the surrounding skin.  No drainage or pus. No pain with calf compression, swelling, warmth, erythema  Assessment: 34 year old female with onychomycosis bilateral hallux  Plan: -All treatment options discussed with the patient including all alternatives, risks, complications.  -She is interested in laser therapy.  Prior to starting this I did debride the nails and sent them for culture today.  This was sent to [facility] labs.  -Patient encouraged to call the office with any questions, concerns, change in symptoms.   Vivi Barrack DPM

## 2019-09-30 ENCOUNTER — Encounter (HOSPITAL_BASED_OUTPATIENT_CLINIC_OR_DEPARTMENT_OTHER): Payer: Self-pay | Admitting: Emergency Medicine

## 2019-09-30 ENCOUNTER — Emergency Department (HOSPITAL_BASED_OUTPATIENT_CLINIC_OR_DEPARTMENT_OTHER): Payer: BC Managed Care – PPO

## 2019-09-30 ENCOUNTER — Other Ambulatory Visit: Payer: Self-pay

## 2019-09-30 ENCOUNTER — Emergency Department (HOSPITAL_BASED_OUTPATIENT_CLINIC_OR_DEPARTMENT_OTHER)
Admission: EM | Admit: 2019-09-30 | Discharge: 2019-09-30 | Disposition: A | Discharge status: Home / Self Care | Payer: BC Managed Care – PPO | Attending: Emergency Medicine | Admitting: Emergency Medicine

## 2019-09-30 DIAGNOSIS — S61210A Laceration without foreign body of right index finger without damage to nail, initial encounter: Secondary | ICD-10-CM | POA: Insufficient documentation

## 2019-09-30 DIAGNOSIS — W230XXA Caught, crushed, jammed, or pinched between moving objects, initial encounter: Secondary | ICD-10-CM | POA: Diagnosis not present

## 2019-09-30 DIAGNOSIS — M79641 Pain in right hand: Secondary | ICD-10-CM

## 2019-09-30 MED ORDER — AMOXICILLIN-POT CLAVULANATE 875-125 MG PO TABS
1.0000 | ORAL_TABLET | Freq: Once | ORAL | Status: AC
  Administered 2019-09-30: 1 via ORAL
  Filled 2019-09-30: qty 1

## 2019-09-30 MED ORDER — AMOXICILLIN-POT CLAVULANATE 875-125 MG PO TABS: 1.0000 | ORAL_TABLET | Freq: Two times a day (BID) | ORAL | 0 refills | Status: DC

## 2019-09-30 MED ORDER — ACETAMINOPHEN 500 MG PO TABS
1000.0000 mg | ORAL_TABLET | Freq: Once | ORAL | Status: AC
  Administered 2019-09-30: 1000 mg via ORAL
  Filled 2019-09-30: qty 2

## 2019-09-30 NOTE — ED Triage Notes (Signed)
Patient presents with complaints of right index and middle finger pain and swelling; states fingers were caught in the door. States occurred approx 4-5 hours ago; denies taking any OTC meds PTA.

## 2019-09-30 NOTE — ED Notes (Signed)
Patient states she is going with her mother to the magistrates office to file a report this am. States she will stay with her mother or sister tonight.

## 2019-09-30 NOTE — ED Provider Notes (Signed)
[facility] EMERGENCY DEPARTMENT Provider Note   CSN: [id number] Arrival date & time: 09/30/19  1017     History Chief Complaint  Patient presents with  . Hand Pain    Amber Mathews is a 34 y.o. female.  The history is provided by the patient.  Hand Pain This is a new problem. The current episode started 6 to 12 hours ago. The problem occurs constantly. The problem has not changed since onset.Pertinent negatives include no chest pain, no abdominal pain, no headaches and no shortness of breath. Nothing aggravates the symptoms. Nothing relieves the symptoms. She has tried nothing for the symptoms. The treatment provided no relief.  Reports closing hand in door at midnight.  Superficial laceration tip of index finger.       Past Medical History:  Diagnosis Date  . Ectopic pregnancy    Had MTX  . Herpes genitalia    last outbreak 2013    Patient Active Problem List   Diagnosis Date Noted  . Onychomycosis 05/09/2019  . Status post vaginal delivery 11/30/2013  . Active labor at term 11/28/2013    Past Surgical History:  Procedure Laterality Date  . NO PAST SURGERIES       OB History    Gravida  5   Para  2   Term  2   Preterm      AB  2   Living  2     SAB      TAB  1   Ectopic  1   Multiple  0   Live Births  2        Obstetric Comments  Pregnant with IUP and ectopic.  Had TAB and discovered ectopic after bleeding continued.         Family History  Problem Relation Age of Onset  . Asthma Daughter   . Cancer Maternal Grandfather        prostate  . Heart disease Paternal Grandfather     Social History   Tobacco Use  . Smoking status: Never Smoker  . Smokeless tobacco: Never Used  Vaping Use  . Vaping Use: Never used  Substance Use Topics  . Alcohol use: Not Currently  . Drug use: No    Home Medications Prior to Admission medications   Medication Sig Start Date End Date Taking? Authorizing Provider  doxycycline  (VIBRAMYCIN) 100 MG capsule Take 1 capsule (100 mg total) by mouth 2 (two) times daily. 03/15/19   Roxy Horseman, PA-C  fluconazole (DIFLUCAN) 150 MG tablet TAKE 1 TABLET (150 MG TOTAL) BY MOUTH ONCE FOR 1 DOSE. REPEAT IN 3 DAYS IF NEEDED. 12/19/18   [provider]  medroxyPROGESTERone (DEPO-PROVERA) 150 MG/ML injection Inject 150 mg into the muscle every 3 (three) months. 02/22/19   [provider]  metroNIDAZOLE (FLAGYL) 500 MG tablet Take 1 tablet (500 mg total) by mouth 2 (two) times daily. 04/20/19   Vanetta Mulders, MD  misoprostol (CYTOTEC) 200 MCG tablet Take 4 tablets (800 mcg total) by mouth every other day as needed for up to 2 doses. 02/15/19   Judeth Horn, NP  terbinafine (LAMISIL) 250 MG tablet Take 1 tablet (250 mg total) by mouth daily. 05/12/19   Park Liter, DPM    Allergies    Patient has no known allergies.  Review of Systems   Review of Systems  Constitutional: Negative for fever.  HENT: Negative for congestion.   Eyes: Negative for visual disturbance.  Respiratory: Negative for  shortness of breath.   Cardiovascular: Negative for chest pain.  Gastrointestinal: Negative for abdominal pain.  Genitourinary: Negative for difficulty urinating.  Musculoskeletal: Positive for arthralgias.  Skin: Positive for wound.  Neurological: Negative for headaches.  Psychiatric/Behavioral: Negative for agitation.  All other systems reviewed and are negative.   Physical Exam Updated Vital Signs BP 114/82 (BP Location: Left Arm)   Pulse 78   Resp 18   Ht 5\' 5"  (1.651 m)   Wt 68 kg   LMP 09/30/2019   SpO2 100%   BMI 24.95 kg/m   Physical Exam Vitals and nursing note reviewed.  Constitutional:      General: She is not in acute distress.    Appearance: Normal appearance.  HENT:     Head: Normocephalic and atraumatic.     Nose: Nose normal.  Eyes:     Conjunctiva/sclera: Conjunctivae normal.     Pupils: Pupils are equal, round, and reactive to  light.  Cardiovascular:     Rate and Rhythm: Normal rate and regular rhythm.     Pulses: Normal pulses.     Heart sounds: Normal heart sounds.  Pulmonary:     Effort: Pulmonary effort is normal.     Breath sounds: Normal breath sounds.  Abdominal:     General: Abdomen is flat. Bowel sounds are normal.     Palpations: Abdomen is soft.     Tenderness: There is no abdominal tenderness. There is no guarding.  Musculoskeletal:        General: Normal range of motion.     Right wrist: Normal.     Right hand: No swelling, deformity, tenderness or bony tenderness. Normal range of motion. Normal strength. Normal sensation. There is no disruption of two-point discrimination. Normal capillary refill. Normal pulse.       Arms:     Cervical back: Normal range of motion and neck supple.  Skin:    General: Skin is warm and dry.     Capillary Refill: Capillary refill takes less than 2 seconds.       Neurological:     General: No focal deficit present.     Mental Status: She is alert and oriented to person, place, and time.     Deep Tendon Reflexes: Reflexes normal.  Psychiatric:        Mood and Affect: Mood normal.        Behavior: Behavior normal.     ED Results / Procedures / Treatments   Labs (all labs ordered are listed, but only abnormal results are displayed) Labs Reviewed - No data to display  EKG None  Radiology No results found.  Procedures Procedures (including critical care time)  Medications Ordered in ED Medications  acetaminophen (TYLENOL) tablet 1,000 mg (has no administration in time range)  amoxicillin-clavulanate (AUGMENTIN) 875-125 MG per tablet 1 tablet (has no administration in time range)    ED Course  I have reviewed the triage vital signs and the nursing notes.  Pertinent labs & imaging results that were available during my care of the patient were reviewed by me and considered in my medical decision making (see chart for details).    Wound care  provided.  It is a superficial laceration.  Steri stripped in the ED.  Covered with antibiotics.    Amber Mathews was evaluated in Emergency Department on 09/30/2019 for the symptoms described in the history of present illness. She was evaluated in the context of the global COVID-19 pandemic, which necessitated consideration  that the patient might be at risk for infection with the SARS-CoV-2 virus that causes COVID-19. Institutional protocols and algorithms that pertain to the evaluation of patients at risk for COVID-19 are in a state of rapid change based on information released by regulatory bodies including the CDC and federal and state organizations. These policies and algorithms were followed during the patient's care in the ED.  Final Clinical Impression(s) / ED Diagnoses Final diagnoses:  Right hand pain   Return for intractable cough, coughing up blood,fevers >100.4 unrelieved by medication, shortness of breath, intractable vomiting, chest pain, shortness of breath, weakness,numbness, changes in speech, facial asymmetry,abdominal pain, passing out,Inability to tolerate liquids or food, cough, altered mental status or any concerns. No signs of systemic illness or infection. The patient is nontoxic-appearing on exam and vital signs are within normal limits.   I have reviewed the triage vital signs and the nursing notes. Pertinent labs &imaging results that were available during my care of the patient were reviewed by me and considered in my medical decision making (see chart for details).After history, exam, and medical workup I feel the patient has beenappropriately medically screened and is safe for discharge home. Pertinent diagnoses were discussed with the patient. Patient was given return precautions.    Ovida Delagarza, MD 09/30/19 [id number]

## 2019-11-21 ENCOUNTER — Encounter (HOSPITAL_BASED_OUTPATIENT_CLINIC_OR_DEPARTMENT_OTHER): Payer: Self-pay | Admitting: Emergency Medicine

## 2019-11-21 ENCOUNTER — Other Ambulatory Visit: Payer: Self-pay

## 2019-11-21 ENCOUNTER — Emergency Department (HOSPITAL_BASED_OUTPATIENT_CLINIC_OR_DEPARTMENT_OTHER)
Admission: EM | Admit: 2019-11-21 | Discharge: 2019-11-21 | Disposition: A | Discharge status: Home / Self Care | Payer: BC Managed Care – PPO | Attending: Emergency Medicine | Admitting: Emergency Medicine

## 2019-11-21 DIAGNOSIS — S39012A Strain of muscle, fascia and tendon of lower back, initial encounter: Secondary | ICD-10-CM | POA: Diagnosis not present

## 2019-11-21 DIAGNOSIS — S3992XA Unspecified injury of lower back, initial encounter: Secondary | ICD-10-CM | POA: Diagnosis present

## 2019-11-21 DIAGNOSIS — Y92410 Unspecified street and highway as the place of occurrence of the external cause: Secondary | ICD-10-CM | POA: Diagnosis not present

## 2019-11-21 MED ORDER — METHOCARBAMOL 500 MG PO TABS: 500.0000 mg | ORAL_TABLET | Freq: Two times a day (BID) | ORAL | 0 refills | Status: DC

## 2019-11-21 MED ORDER — NAPROXEN 500 MG PO TABS: 500.0000 mg | ORAL_TABLET | Freq: Two times a day (BID) | ORAL | 0 refills | Status: DC
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
## 2019-12-11 ENCOUNTER — Encounter (HOSPITAL_BASED_OUTPATIENT_CLINIC_OR_DEPARTMENT_OTHER): Payer: Self-pay | Admitting: *Deleted

## 2019-12-11 ENCOUNTER — Emergency Department (HOSPITAL_BASED_OUTPATIENT_CLINIC_OR_DEPARTMENT_OTHER): Payer: BC Managed Care – PPO

## 2019-12-11 ENCOUNTER — Telehealth (HOSPITAL_BASED_OUTPATIENT_CLINIC_OR_DEPARTMENT_OTHER): Payer: Self-pay

## 2019-12-11 ENCOUNTER — Other Ambulatory Visit: Payer: Self-pay

## 2019-12-11 ENCOUNTER — Emergency Department (HOSPITAL_BASED_OUTPATIENT_CLINIC_OR_DEPARTMENT_OTHER)
Admission: EM | Admit: 2019-12-11 | Discharge: 2019-12-11 | Disposition: A | Discharge status: Home / Self Care | Payer: BC Managed Care – PPO | Attending: Emergency Medicine | Admitting: Emergency Medicine

## 2019-12-11 DIAGNOSIS — J069 Acute upper respiratory infection, unspecified: Secondary | ICD-10-CM | POA: Insufficient documentation

## 2019-12-11 DIAGNOSIS — Z20822 Contact with and (suspected) exposure to covid-19: Secondary | ICD-10-CM | POA: Diagnosis not present

## 2019-12-11 DIAGNOSIS — R059 Cough, unspecified: Secondary | ICD-10-CM | POA: Diagnosis present

## 2019-12-11 LAB — RESP PANEL BY RT-PCR (FLU A&B, COVID) ARPGX2
Influenza A by PCR: NEGATIVE
Influenza B by PCR: NEGATIVE
SARS Coronavirus 2 by RT PCR: POSITIVE — AB

## 2019-12-11 NOTE — Discharge Instructions (Addendum)
You are seen today for upper respiratory infection.  Your Covid testing and flu testing is pending.  Check MyChart for the results.  Your chest x-ray does not show any evidence of pneumonia.

## 2019-12-11 NOTE — Telephone Encounter (Signed)
Patient called and made aware of positive Covid and need to quarantine to follow CDC guidelines. No questions or concerns at this time.

## 2019-12-11 NOTE — ED Triage Notes (Signed)
Pt states that she has had cough, weakness, sore throat and body aches since Saturday. Has only taken Mucinex at night. Has not been vaccinated. C/o anterior chest pain with cough. Denies any sob.

## 2019-12-11 NOTE — ED Provider Notes (Signed)
[facility] EMERGENCY DEPARTMENT Provider Note   CSN: [id number] Arrival date & time: 12/11/19  0455     History Chief Complaint  Patient presents with  . covid symptoms    Amber Mathews is a 34 y.o. female.  HPI     This is a 34 year old female who presents with cough, fatigue, sore throat, and chills.  Onset of symptoms on Saturday.  Patient states "I think I have Covid."  She reports that she contracted COVID-19 1 year ago.  Since that time she has not been vaccinated.  Denies any shortness of breath.  She reports fever up to 101 at home.  She has taken Mucinex with some relief.  Past Medical History:  Diagnosis Date  . Ectopic pregnancy    Had MTX  . Herpes genitalia    last outbreak 2013    Patient Active Problem List   Diagnosis Date Noted  . Onychomycosis 05/09/2019  . Status post vaginal delivery 11/30/2013  . Active labor at term 11/28/2013    Past Surgical History:  Procedure Laterality Date  . NO PAST SURGERIES       OB History    Gravida  5   Para  2   Term  2   Preterm      AB  2   Living  2     SAB      TAB  1   Ectopic  1   Multiple  0   Live Births  2        Obstetric Comments  Pregnant with IUP and ectopic.  Had TAB and discovered ectopic after bleeding continued.         Family History  Problem Relation Age of Onset  . Asthma Daughter   . Cancer Maternal Grandfather        prostate  . Heart disease Paternal Grandfather     Social History   Tobacco Use  . Smoking status: Never Smoker  . Smokeless tobacco: Never Used  Vaping Use  . Vaping Use: Never used  Substance Use Topics  . Alcohol use: Not Currently  . Drug use: No    Home Medications Prior to Admission medications   Not on File    Allergies    Patient has no known allergies.  Review of Systems   Review of Systems  Constitutional: Positive for chills and fever.  HENT: Positive for sore throat.   Respiratory: Positive for  cough. Negative for shortness of breath.   Cardiovascular: Negative for chest pain.  Gastrointestinal: Negative for abdominal pain, diarrhea and vomiting.  Genitourinary: Negative for dysuria.  All other systems reviewed and are negative.   Physical Exam Updated Vital Signs BP 116/89 (BP Location: Right Arm)   Pulse 73   Temp 98.5 F (36.9 C) (Oral)   Resp 18   Ht 1.549 m (5\' 1" )   Wt 65.8 kg   LMP 11/14/2018   SpO2 100%   BMI 27.40 kg/m   Physical Exam Vitals and nursing note reviewed.  Constitutional:      Appearance: She is well-developed. She is not ill-appearing.  HENT:     Head: Normocephalic and atraumatic.     Nose: Nose normal.     Mouth/Throat:     Mouth: Mucous membranes are moist.     Comments: Slight posterior oropharyngeal erythema, no tonsillar enlargement or exudate, uvula midline Eyes:     Pupils: Pupils are equal, round, and reactive to light.  Cardiovascular:  Rate and Rhythm: Normal rate and regular rhythm.     Heart sounds: Normal heart sounds.  Pulmonary:     Effort: Pulmonary effort is normal. No respiratory distress.     Breath sounds: No wheezing.  Abdominal:     Palpations: Abdomen is soft.     Tenderness: There is no abdominal tenderness.  Musculoskeletal:     Cervical back: Neck supple.     Right lower leg: No edema.     Left lower leg: No edema.  Skin:    General: Skin is warm and dry.  Neurological:     Mental Status: She is alert and oriented to person, place, and time.  Psychiatric:        Mood and Affect: Mood normal.     ED Results / Procedures / Treatments   Labs (all labs ordered are listed, but only abnormal results are displayed) Labs Reviewed  RESP PANEL BY RT-PCR (FLU A&B, COVID) ARPGX2 - Abnormal; Notable for the following components:      Result Value   SARS Coronavirus 2 by RT PCR POSITIVE (*)    All other components within normal limits    EKG None  Radiology DG Chest Portable 1 View  Result Date:  12/11/2019 CLINICAL DATA:  Cough. EXAM: PORTABLE CHEST 1 VIEW COMPARISON:  10/06/2018 FINDINGS: The cardiac silhouette, mediastinal hilar contours are within limits. The lungs are clear. No pleural effusions. No pulmonary lesions. The bony thorax is intact. IMPRESSION: No acute cardiopulmonary findings. Electronically Signed   By: Rudie Meyer M.D.   On: 12/11/2019 06:18    Procedures Procedures (including critical care time)  Medications Ordered in ED Medications - No data to display  ED Course  I have reviewed the triage vital signs and the nursing notes.  Pertinent labs & imaging results that were available during my care of the patient were reviewed by me and considered in my medical decision making (see chart for details).    MDM Rules/Calculators/A&P                          Patient presents with upper respiratory symptoms.  She reports that she had COVID-19 1 year ago has not since been vaccinated.  She states that she feels like she has Covid again.  She is nontoxic-appearing and vital signs are reassuring.  Chest x-ray shows no evidence of pneumonia or pneumothorax.  There was a delay in Covid testing.  I checked on the patient and she was clinically stable.  She has elected to review her results in MyChart.  She was advised if she is Covid positive she would need to quarantine.  Patient stated understanding.  Covid testing pending.  After history, exam, and medical workup I feel the patient has been appropriately medically screened and is safe for discharge home. Pertinent diagnoses were discussed with the patient. Patient was given return precautions.  Final Clinical Impression(s) / ED Diagnoses Final diagnoses:  Viral URI with cough    Rx / DC Orders ED Discharge Orders    None       Shon Baton, MD 12/11/19 2352
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
## 2020-07-26 ENCOUNTER — Emergency Department (HOSPITAL_BASED_OUTPATIENT_CLINIC_OR_DEPARTMENT_OTHER)
Admission: EM | Admit: 2020-07-26 | Discharge: 2020-07-26 | Disposition: A | Discharge status: Home / Self Care | Payer: BC Managed Care – PPO | Attending: Emergency Medicine | Admitting: Emergency Medicine

## 2020-07-26 ENCOUNTER — Emergency Department (HOSPITAL_BASED_OUTPATIENT_CLINIC_OR_DEPARTMENT_OTHER): Payer: BC Managed Care – PPO

## 2020-07-26 ENCOUNTER — Other Ambulatory Visit: Payer: Self-pay

## 2020-07-26 ENCOUNTER — Encounter (HOSPITAL_BASED_OUTPATIENT_CLINIC_OR_DEPARTMENT_OTHER): Payer: Self-pay | Admitting: Emergency Medicine

## 2020-07-26 DIAGNOSIS — B9689 Other specified bacterial agents as the cause of diseases classified elsewhere: Secondary | ICD-10-CM

## 2020-07-26 DIAGNOSIS — R102 Pelvic and perineal pain: Secondary | ICD-10-CM | POA: Diagnosis present

## 2020-07-26 DIAGNOSIS — N760 Acute vaginitis: Secondary | ICD-10-CM | POA: Insufficient documentation

## 2020-07-26 LAB — URINALYSIS, ROUTINE W REFLEX MICROSCOPIC
Bilirubin Urine: NEGATIVE
Glucose, UA: NEGATIVE mg/dL
Hgb urine dipstick: NEGATIVE
Ketones, ur: NEGATIVE mg/dL
Nitrite: NEGATIVE
Protein, ur: NEGATIVE mg/dL
Specific Gravity, Urine: <1.005 — ABNORMAL LOW (ref 1.005–1.030)
pH: 7.5 (ref 5.0–8.0)

## 2020-07-26 LAB — CBC WITH DIFFERENTIAL/PLATELET
Abs Immature Granulocytes: 0.03 10*3/uL (ref 0.00–0.07)
Basophils Absolute: 0 10*3/uL (ref 0.0–0.1)
Basophils Relative: 0 %
Eosinophils Absolute: 0 10*3/uL (ref 0.0–0.5)
Eosinophils Relative: 0 %
HCT: 41.1 % (ref 36.0–46.0)
Hemoglobin: 13.8 g/dL (ref 12.0–15.0)
Immature Granulocytes: 0 %
Lymphocytes Relative: 14 %
Lymphs Abs: 1.5 10*3/uL (ref 0.7–4.0)
MCH: 29.7 pg (ref 26.0–34.0)
MCHC: 33.6 g/dL (ref 30.0–36.0)
MCV: 88.4 fL (ref 80.0–100.0)
Monocytes Absolute: 0.6 10*3/uL (ref 0.1–1.0)
Monocytes Relative: 6 %
Neutro Abs: 8.2 10*3/uL — ABNORMAL HIGH (ref 1.7–7.7)
Neutrophils Relative %: 80 %
Platelets: 278 10*3/uL (ref 150–400)
RBC: 4.65 MIL/uL (ref 3.87–5.11)
RDW: 12.5 % (ref 11.5–15.5)
WBC: 10.4 10*3/uL (ref 4.0–10.5)
nRBC: 0 % (ref 0.0–0.2)

## 2020-07-26 LAB — COMPREHENSIVE METABOLIC PANEL
ALT: 11 U/L (ref 0–44)
AST: 16 U/L (ref 15–41)
Albumin: 3.7 g/dL (ref 3.5–5.0)
Alkaline Phosphatase: 85 U/L (ref 38–126)
Anion gap: 5 (ref 5–15)
BUN: 7 mg/dL (ref 6–20)
CO2: 28 mmol/L (ref 22–32)
Calcium: 9.1 mg/dL (ref 8.9–10.3)
Chloride: 104 mmol/L (ref 98–111)
Creatinine, Ser: 0.87 mg/dL (ref 0.44–1.00)
GFR, Estimated: >60 mL/min (ref >60)
Glucose, Bld: 108 mg/dL — ABNORMAL HIGH (ref 70–99)
Potassium: 3.8 mmol/L (ref 3.5–5.1)
Sodium: 137 mmol/L (ref 135–145)
Total Bilirubin: 1.1 mg/dL (ref 0.3–1.2)
Total Protein: 6.8 g/dL (ref 6.5–8.1)

## 2020-07-26 LAB — WET PREP, GENITAL
Sperm: NONE SEEN
Trich, Wet Prep: NONE SEEN
Yeast Wet Prep HPF POC: NONE SEEN

## 2020-07-26 LAB — URINALYSIS, MICROSCOPIC (REFLEX): RBC / HPF: NONE SEEN RBC/hpf (ref 0–5)

## 2020-07-26 LAB — PREGNANCY, URINE: Preg Test, Ur: NEGATIVE

## 2020-07-26 MED ORDER — ONDANSETRON 4 MG PO TBDP
8.0000 mg | ORAL_TABLET | Freq: Once | ORAL | Status: AC
  Administered 2020-07-26: 8 mg via ORAL
  Filled 2020-07-26: qty 2

## 2020-07-26 MED ORDER — DOXYCYCLINE HYCLATE 100 MG PO CAPS: 100.0000 mg | ORAL_CAPSULE | Freq: Two times a day (BID) | ORAL | 0 refills | Status: AC

## 2020-07-26 MED ORDER — METRONIDAZOLE 500 MG PO TABS
2000.0000 mg | ORAL_TABLET | Freq: Once | ORAL | Status: AC
  Administered 2020-07-26: 2000 mg via ORAL
  Filled 2020-07-26: qty 4

## 2020-07-26 MED ORDER — CEFTRIAXONE SODIUM 500 MG IJ SOLR
500.0000 mg | Freq: Once | INTRAMUSCULAR | Status: AC
  Administered 2020-07-26: 500 mg via INTRAMUSCULAR
  Filled 2020-07-26: qty 500

## 2020-07-26 MED ORDER — LIDOCAINE HCL (PF) 1 % IJ SOLN
INTRAMUSCULAR | Status: AC
  Filled 2020-07-26: qty 5

## 2020-07-26 MED ORDER — OXYCODONE-ACETAMINOPHEN 5-325 MG PO TABS
1.0000 | ORAL_TABLET | Freq: Once | ORAL | Status: AC
  Administered 2020-07-26: 1 via ORAL
  Filled 2020-07-26: qty 1

## 2020-07-26 NOTE — Discharge Instructions (Addendum)
You received Flagyl for BV as well as Rocephin for possible gonorrhea in the emergency department.  I am also prescribing you doxycycline.  I want you to take this twice a day for the next 10 days.  Do not stop taking this medication early.  Please refrain from sex for the next 2 weeks until you have completed your antibiotics.  If you develop any new or worsening symptoms you need to come back to the emergency department immediately for reevaluation.  Please also follow-up with your regular doctor.  It was a pleasure to meet you.

## 2020-07-26 NOTE — ED Triage Notes (Signed)
Reports pelvic pain that started last night with clear discharge.  Denies any n/v or urinary symptoms.

## 2020-07-26 NOTE — ED Provider Notes (Signed)
[facility] EMERGENCY DEPARTMENT Provider Note   CSN: [id number] Arrival date & time: 07/26/20  1613     History Chief Complaint  Patient presents with   Pelvic Pain    Amber Mathews is a 35 y.o. female.  HPI Patient is a 35 year old female with a medical history as noted below.  She presents to the emergency department due to pelvic pain that started around 2 AM last night.  She states her pain woke her up from sleep.  Denies any nausea, vomiting, or diarrhea.  No fevers.  She states that she has clear vaginal discharge which is baseline for her.  She does not feel that this is worsened.  Denies any dysuria or urinary frequency.  She states she is sexually active with 1 female partner and they do not use contraception.     Past Medical History:  Diagnosis Date   Ectopic pregnancy    Had MTX   Herpes genitalia    last outbreak 2013    Patient Active Problem List   Diagnosis Date Noted   Onychomycosis 05/09/2019   Status post vaginal delivery 11/30/2013   Active labor at term 11/28/2013    Past Surgical History:  Procedure Laterality Date   NO PAST SURGERIES       OB History     Gravida  5   Para  2   Term  2   Preterm      AB  2   Living  2      SAB      IAB  1   Ectopic  1   Multiple  0   Live Births  2        Obstetric Comments  Pregnant with IUP and ectopic.  Had TAB and discovered ectopic after bleeding continued.          Family History  Problem Relation Age of Onset   Asthma Daughter    Cancer Maternal Grandfather        prostate   Heart disease Paternal Grandfather     Social History   Tobacco Use   Smoking status: Never   Smokeless tobacco: Never  Vaping Use   Vaping Use: Never used  Substance Use Topics   Alcohol use: Not Currently   Drug use: No    Home Medications Prior to Admission medications   Medication Sig Start Date End Date Taking? Authorizing Provider  doxycycline (VIBRAMYCIN) 100 MG  capsule Take 1 capsule (100 mg total) by mouth 2 (two) times daily for 10 days. 07/26/20 08/05/20 Yes Placido Sou, PA-C    Allergies    Patient has no known allergies.  Review of Systems   Review of Systems  All other systems reviewed and are negative. Ten systems reviewed and are negative for acute change, except as noted in the HPI.   Physical Exam Updated Vital Signs BP 106/65   Pulse 72   Temp 99.4 F (37.4 C) (Oral)   Resp 18   Ht 5\' 5"  (1.651 m)   Wt 69.8 kg   LMP 07/22/2020   SpO2 99%   BMI 25.59 kg/m   Physical Exam Vitals and nursing note reviewed.  Constitutional:      General: She is not in acute distress.    Appearance: Normal appearance. She is not ill-appearing, toxic-appearing or diaphoretic.  HENT:     Head: Normocephalic and atraumatic.     Right Ear: External ear normal.     Left Ear:  External ear normal.     Nose: Nose normal.     Mouth/Throat:     Mouth: Mucous membranes are moist.     Pharynx: Oropharynx is clear. No oropharyngeal exudate or posterior oropharyngeal erythema.  Eyes:     General: No scleral icterus.       Right eye: No discharge.        Left eye: No discharge.     Extraocular Movements: Extraocular movements intact.     Conjunctiva/sclera: Conjunctivae normal.  Cardiovascular:     Rate and Rhythm: Normal rate and regular rhythm.     Pulses: Normal pulses.     Heart sounds: Normal heart sounds. No murmur heard.   No friction rub. No gallop.  Pulmonary:     Effort: Pulmonary effort is normal. No respiratory distress.     Breath sounds: Normal breath sounds. No stridor. No wheezing, rhonchi or rales.  Abdominal:     General: Abdomen is flat.     Palpations: Abdomen is soft.     Tenderness: There is abdominal tenderness.     Comments: Abdomen is flat and soft.  Mild TTP noted diffusely in the lower abdomen.  Genitourinary:    Comments: Female nursing chaperone present.  Normal-appearing vulvar anatomy.  Normal-appearing  vaginal mucosa.  Cervical os is closed.  Cervix is nonerythematous.  Small amount of white/yellow discharge noted in the vaginal vault.  Minimal cervical motion tenderness.  Mild bilateral adnexal tenderness. Musculoskeletal:        General: Normal range of motion.     Cervical back: Normal range of motion and neck supple. No tenderness.  Skin:    General: Skin is warm and dry.  Neurological:     General: No focal deficit present.     Mental Status: She is alert and oriented to person, place, and time.  Psychiatric:        Mood and Affect: Mood normal.        Behavior: Behavior normal.   ED Results / Procedures / Treatments   Labs (all labs ordered are listed, but only abnormal results are displayed) Labs Reviewed  WET PREP, GENITAL - Abnormal; Notable for the following components:      Result Value   Clue Cells Wet Prep HPF POC PRESENT (*)    WBC, Wet Prep HPF POC MODERATE (*)    All other components within normal limits  URINALYSIS, ROUTINE W REFLEX MICROSCOPIC - Abnormal; Notable for the following components:   Color, Urine STRAW (*)    APPearance HAZY (*)    Specific Gravity, Urine <1.005 (*)    Leukocytes,Ua MODERATE (*)    All other components within normal limits  URINALYSIS, MICROSCOPIC (REFLEX) - Abnormal; Notable for the following components:   Bacteria, UA MANY (*)    All other components within normal limits  CBC WITH DIFFERENTIAL/PLATELET - Abnormal; Notable for the following components:   Neutro Abs 8.2 (*)    All other components within normal limits  COMPREHENSIVE METABOLIC PANEL - Abnormal; Notable for the following components:   Glucose, Bld 108 (*)    All other components within normal limits  PREGNANCY, URINE  GC/CHLAMYDIA PROBE AMP ([place]) NOT [facility]    EKG None  Radiology US PELVIC COMPLETE W TRANSVAGINAL AND TORSION R/O  Result Date: 07/26/2020 CLINICAL DATA:  Suprapubic pain. EXAM: TRANSABDOMINAL AND TRANSVAGINAL ULTRASOUND OF PELVIS  DOPPLER ULTRASOUND OF OVARIES TECHNIQUE: Both transabdominal and transvaginal ultrasound examinations of the pelvis were performed. Transabdominal technique  was performed for global imaging of the pelvis including uterus, ovaries, adnexal regions, and pelvic cul-de-sac. It was necessary to proceed with endovaginal exam following the transabdominal exam to visualize the uterus, endometrium, bilateral ovaries and bilateral adnexa. Color and duplex Doppler ultrasound was utilized to evaluate blood flow to the ovaries. COMPARISON:  February 15, 2019 FINDINGS: Uterus Measurements: 9.1 cm x 5.7 cm x 5.8 cm = volume: 155.2 mL. No fibroids or other mass visualized. Endometrium Thickness: 10 mm. Mildly thickened, likely secondary to the stage of the patient's menstrual cycle (began 4 days ago). No focal abnormality visualized. Right ovary Measurements: 3.0 cm x 2.2 cm x 2.5 cm = volume: 8.4 mL. Normal appearance/no adnexal mass. Left ovary Measurements: 3.7 cm x 2.6 cm x 2.2 cm = volume: 11.1 mL. Normal appearance/no adnexal mass. Pulsed Doppler evaluation of both ovaries demonstrates normal low-resistance arterial and venous waveforms. Other findings No abnormal free fluid. IMPRESSION: 1. Normal pelvic ultrasound. Electronically Signed   By: Aram Candela M.D.   On: 07/26/2020 20:12    Procedures Procedures   Medications Ordered in ED Medications  oxyCODONE-acetaminophen (PERCOCET/ROXICET) 5-325 MG per tablet 1 tablet (1 tablet Oral Given 07/26/20 2140)  cefTRIAXone (ROCEPHIN) injection 500 mg (500 mg Intramuscular Given 07/26/20 2257)  metroNIDAZOLE (FLAGYL) tablet 2,000 mg (2,000 mg Oral Given 07/26/20 2255)  ondansetron (ZOFRAN-ODT) disintegrating tablet 8 mg (8 mg Oral Given 07/26/20 2255)  lidocaine (PF) (XYLOCAINE) 1 % injection (  Given 07/26/20 2302)   ED Course  I have reviewed the triage vital signs and the nursing notes.  Pertinent labs & imaging results that were available during my care of the  patient were reviewed by me and considered in my medical decision making (see chart for details).    MDM Rules/Calculators/A&P                          Pt is a 35 y.o. female who presents to the emergency department with pelvic pain.  Labs: CBC with neutrophils of 8.2. CMP with a glucose of 108. Urine pregnancy test is negative. UA shows decreased specific gravity, moderate leukocytes, many bacteria, white blood cell clumps. Wet prep shows clue cells as well as moderate white blood cells. GC/chlamydia is pending.  Imaging: Transvaginal ultrasound shows no abnormalities.  I, Placido Sou, PA-C, personally reviewed and evaluated these images and lab results as part of my medical decision-making.  Unsure the source of the patient's symptoms.  Pelvic exam significant for mild to moderate amount of discharge in the vaginal vault.  There was some mild bilateral adnexal tenderness as well as minimal cervical motion tenderness.  Transvaginal ultrasound reassuring.  Doubt torsion.  Patient states that she is sexually active with 1 female partner but is unsure if he is monogamous.  Discussed her symptoms possibly being secondary to a developing PID.  Patient does have moderate white blood cells on her wet prep.  We discussed treatment in the emergency department versus waiting for the final results of her GC/chlamydia testing.  She elected to be treated in the emergency department.  She was given a dose of Flagyl in the emergency department for her BV.  She was also given IM Rocephin.  Will discharge on a 10-day course of doxycycline.  Her pregnancy test was negative.  Patient understands to refrain from sex for the next 2 weeks until her symptoms have resolved.  Urged her to continue to monitor symptoms closely and come back  to the emergency department if they worsen.  Feel that she is stable for discharge at this time and she is agreeable.  Her questions were answered and she was amicable at the  time of discharge.  Note: Portions of this report may have been transcribed using voice recognition software. Every effort was made to ensure accuracy; however, inadvertent computerized transcription errors may be present.   Final Clinical Impression(s) / ED Diagnoses Final diagnoses:  Pelvic pain in female  BV (bacterial vaginosis)    Rx / DC Orders ED Discharge Orders          Ordered    doxycycline (VIBRAMYCIN) 100 MG capsule  2 times daily        07/26/20 2245             Placido Sou, PA-C 07/26/20 2340    Charlynne Pander, MD 07/29/20 1450

## 2020-07-26 NOTE — ED Notes (Signed)
Patient transported to US 

## 2020-07-26 NOTE — ED Notes (Signed)
Pt. States she has a ride home.

## 2020-07-29 LAB — GC/CHLAMYDIA PROBE AMP (~~LOC~~) NOT AT ARMC
Chlamydia: NEGATIVE
Comment: NEGATIVE
Comment: NORMAL
Neisseria Gonorrhea: NEGATIVE

## 2021-05-18 ENCOUNTER — Emergency Department (HOSPITAL_BASED_OUTPATIENT_CLINIC_OR_DEPARTMENT_OTHER)
Admission: EM | Admit: 2021-05-18 | Discharge: 2021-05-19 | Disposition: A | Discharge status: Home / Self Care | Payer: BC Managed Care – PPO | Attending: Emergency Medicine | Admitting: Emergency Medicine

## 2021-05-18 ENCOUNTER — Other Ambulatory Visit: Payer: Self-pay

## 2021-05-18 ENCOUNTER — Encounter (HOSPITAL_BASED_OUTPATIENT_CLINIC_OR_DEPARTMENT_OTHER): Payer: Self-pay

## 2021-05-18 DIAGNOSIS — H1033 Unspecified acute conjunctivitis, bilateral: Secondary | ICD-10-CM | POA: Diagnosis not present

## 2021-05-18 DIAGNOSIS — H5713 Ocular pain, bilateral: Secondary | ICD-10-CM | POA: Diagnosis present

## 2021-05-18 NOTE — ED Provider Notes (Signed)
? ?[facility] ?Provider Note: Georgena Spurling, MD, [place] ? ?CSN: [id number] ?MRN: [id number] ?ARRIVAL: 05/18/21 at 2349 ?ROOM: MH01/[facility]01 ? ? ?CHIEF COMPLAINT  ?Eye Pain ? ? ?HISTORY OF PRESENT ILLNESS  ?05/18/21 11:58 PM ?Amber Mathews is a 36 y.o. female who has developed bilateral conjunctival injection, itching, discomfort and serous drainage over the last hour.  She is not aware of any injury or exposure that may have triggered this.  She has not had cold symptoms.  She describes the discomfort as a 6 out of 10, aching and burning in nature.  She denies blurred vision. ? ? ?Past Medical History:  ?Diagnosis Date  ? Ectopic pregnancy   ? Had MTX  ? Herpes genitalia   ? last outbreak 2013  ? ? ?Past Surgical History:  ?Procedure Laterality Date  ? NO PAST SURGERIES    ? ? ?Family History  ?Problem Relation Age of Onset  ? Asthma Daughter   ? Cancer Maternal Grandfather   ?     prostate  ? Heart disease Paternal Grandfather   ? ? ?Social History  ? ?Tobacco Use  ? Smoking status: Never  ? Smokeless tobacco: Never  ?Vaping Use  ? Vaping Use: Never used  ?Substance Use Topics  ? Alcohol use: Not Currently  ? Drug use: No  ? ? ?Prior to Admission medications   ?Medication Sig Start Date End Date Taking? Authorizing Provider  ?ketorolac (ACULAR) 0.5 % ophthalmic solution Place 1 drop into both eyes every 6 (six) hours. 05/19/21  Yes Bertran Zeimet, Jenny Reichmann, MD  ? ? ?Allergies ?Patient has no known allergies. ? ? ?REVIEW OF SYSTEMS  ?Negative except as noted here or in the History of Present Illness. ? ? ?PHYSICAL EXAMINATION  ?Initial Vital Signs ?Blood pressure 122/81, pulse 85, temperature 98.2 ?F (36.8 ?C), temperature source Oral, resp. rate 14, height 5\' 5"  (1.651 m), weight 72.6 kg, last menstrual period 04/29/2021, SpO2 100 %, unknown if currently breastfeeding. ? ?Examination ?General: Well-developed, well-nourished female in no acute distress; appearance consistent with age of record ?HENT: normocephalic;  atraumatic ?Eyes: pupils equal, round and reactive to light; extraocular muscles intact; bilateral conjunctival injection; serous exudate; globes soft and nontender; no hyphema; no hypopyon; no subconjunctival hemorrhage ?Neck: supple ?Heart: regular rate and rhythm ?Lungs: clear to auscultation bilaterally ?Abdomen: soft; nondistended; nontender; bowel sounds present ?Extremities: No deformity; full range of motion ?Neurologic: Awake, alert and oriented; motor function intact in all extremities and symmetric; no facial droop ?Skin: Warm and dry ?Psychiatric: Normal mood and affect ? ? ?RESULTS  ?Summary of this visit's results, reviewed and interpreted by myself: ? ? EKG Interpretation ? ?Date/Time:    ?Ventricular Rate:    ?PR Interval:    ?QRS Duration:   ?QT Interval:    ?QTC Calculation:   ?R Axis:     ?Text Interpretation:   ?  ? ?  ? ?Laboratory Studies: ?No results found for this or any previous visit (from the past 24 hour(s)). ?Imaging Studies: ?No results found. ? ?ED COURSE and MDM  ?Nursing notes, initial and subsequent vitals signs, including pulse oximetry, reviewed and interpreted by myself. ? ?Vitals:  ? 05/18/21 2356 05/18/21 2356  ?BP:  122/81  ?Pulse:  85  ?Resp:  14  ?Temp:  98.2 ?F (36.8 ?C)  ?TempSrc:  Oral  ?SpO2:  100%  ?Weight: 72.6 kg   ?Height: 5\' 5"  (1.651 m)   ? ?Medications  ?ofloxacin (OCUFLOX) 0.3 % ophthalmic solution 1 drop (  has no administration in time range)  ? ?Patient's presentation is consistent with acute conjunctivitis.  At this point it appears viral but she is early in the course and it could still be bacterial or she could develop a bacterial superinfection.  We will provide antibiotic drops as well as ketorolac drops and refer to ophthalmology.  She was advised that this is a contagious condition. ? ?PROCEDURES  ?Procedures ? ? ?ED DIAGNOSES  ? ?  ICD-10-CM   ?1. Acute conjunctivitis of both eyes, unspecified acute conjunctivitis type  H10.33   ?  ? ? ? ?  ?Larance Ratledge,  Tajon Moring, MD ?05/19/21 0008 ? ?

## 2021-05-18 NOTE — ED Triage Notes (Signed)
45 minutes ago her eyes started itching, draining clear fluid, now they hurt. ?

## 2021-05-19 MED ORDER — OFLOXACIN 0.3 % OP SOLN
1.0000 [drp] | Freq: Four times a day (QID) | OPHTHALMIC | Status: DC
  Administered 2021-05-19: 1 [drp] via OPHTHALMIC
  Filled 2021-05-19: qty 5

## 2021-05-19 MED ORDER — KETOROLAC TROMETHAMINE 0.5 % OP SOLN: 1.0000 [drp] | Freq: Four times a day (QID) | OPHTHALMIC | 0 refills | Status: DC

## 2021-06-27 ENCOUNTER — Other Ambulatory Visit: Payer: Self-pay

## 2021-06-27 ENCOUNTER — Encounter (HOSPITAL_BASED_OUTPATIENT_CLINIC_OR_DEPARTMENT_OTHER): Payer: Self-pay

## 2021-06-27 ENCOUNTER — Emergency Department (HOSPITAL_BASED_OUTPATIENT_CLINIC_OR_DEPARTMENT_OTHER)
Admission: EM | Admit: 2021-06-27 | Discharge: 2021-06-27 | Disposition: A | Discharge status: Home / Self Care | Payer: BC Managed Care – PPO | Attending: Emergency Medicine | Admitting: Emergency Medicine

## 2021-06-27 DIAGNOSIS — Y288XXA Contact with other sharp object, undetermined intent, initial encounter: Secondary | ICD-10-CM | POA: Diagnosis not present

## 2021-06-27 DIAGNOSIS — S81811A Laceration without foreign body, right lower leg, initial encounter: Secondary | ICD-10-CM | POA: Diagnosis not present

## 2021-06-27 DIAGNOSIS — Z23 Encounter for immunization: Secondary | ICD-10-CM | POA: Insufficient documentation

## 2021-06-27 DIAGNOSIS — S75001A Unspecified injury of femoral artery, right leg, initial encounter: Secondary | ICD-10-CM | POA: Diagnosis present

## 2021-06-27 MED ORDER — LIDOCAINE-EPINEPHRINE (PF) 2 %-1:200000 IJ SOLN
10.0000 mL | Freq: Once | INTRAMUSCULAR | Status: AC
  Administered 2021-06-27: 10 mL
  Filled 2021-06-27: qty 20

## 2021-06-27 MED ORDER — TETANUS-DIPHTH-ACELL PERTUSSIS 5-2.5-18.5 LF-MCG/0.5 IM SUSY
0.5000 mL | PREFILLED_SYRINGE | Freq: Once | INTRAMUSCULAR | Status: AC
  Administered 2021-06-27: 0.5 mL via INTRAMUSCULAR
  Filled 2021-06-27: qty 0.5
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
## 2021-08-24 ENCOUNTER — Encounter (HOSPITAL_BASED_OUTPATIENT_CLINIC_OR_DEPARTMENT_OTHER): Payer: Self-pay | Admitting: Urology

## 2021-08-24 ENCOUNTER — Other Ambulatory Visit: Payer: Self-pay

## 2021-08-24 ENCOUNTER — Emergency Department (HOSPITAL_BASED_OUTPATIENT_CLINIC_OR_DEPARTMENT_OTHER)
Admission: EM | Admit: 2021-08-24 | Discharge: 2021-08-24 | Disposition: A | Discharge status: Home / Self Care | Payer: BC Managed Care – PPO | Attending: Emergency Medicine | Admitting: Emergency Medicine

## 2021-08-24 DIAGNOSIS — R519 Headache, unspecified: Secondary | ICD-10-CM | POA: Diagnosis present

## 2021-08-24 DIAGNOSIS — R110 Nausea: Secondary | ICD-10-CM | POA: Insufficient documentation

## 2021-08-24 MED ORDER — PROCHLORPERAZINE EDISYLATE 10 MG/2ML IJ SOLN
10.0000 mg | Freq: Once | INTRAMUSCULAR | Status: AC
  Administered 2021-08-24: 10 mg via INTRAMUSCULAR
  Filled 2021-08-24: qty 2

## 2021-08-24 MED ORDER — KETOROLAC TROMETHAMINE 30 MG/ML IJ SOLN
30.0000 mg | Freq: Once | INTRAMUSCULAR | Status: AC
  Administered 2021-08-24: 30 mg via INTRAMUSCULAR
  Filled 2021-08-24: qty 1

## 2021-08-24 NOTE — ED Provider Notes (Signed)
[facility] EMERGENCY DEPARTMENT  Provider Note  CSN: [id number] Arrival date & time: 08/24/21 0522  History Chief Complaint  Patient presents with   Headache    Amber Mathews is a 36 y.o. female reports 3 days of frontal headache, worse on the right, associated with nausea. No photophobia or vomiting. No fever or nasal congestion. Some eye watering. Not a thunderclap headache. Not the worst headache of her life. Not improved with APAP at home.    Home Medications Prior to Admission medications   Medication Sig Start Date End Date Taking? Authorizing Provider  ketorolac (ACULAR) 0.5 % ophthalmic solution Place 1 drop into both eyes every 6 (six) hours. 05/19/21   Molpus, Jonny Ruiz, MD     Allergies    Patient has no known allergies.   Review of Systems   Review of Systems Please see HPI for pertinent positives and negatives  Physical Exam BP 118/89 (BP Location: Right Arm)   Pulse 82   Temp 98.2 F (36.8 C) (Oral)   Resp 18   Ht 5\' 5"  (1.651 m)   Wt 74.8 kg   LMP 08/11/2021 (Approximate)   SpO2 100%   BMI 27.46 kg/m   Physical Exam Vitals and nursing note reviewed.  Constitutional:      Appearance: Normal appearance.  HENT:     Head: Normocephalic and atraumatic.     Nose: Nose normal.     Mouth/Throat:     Mouth: Mucous membranes are moist.  Eyes:     Extraocular Movements: Extraocular movements intact.     Conjunctiva/sclera: Conjunctivae normal.  Cardiovascular:     Rate and Rhythm: Normal rate.  Pulmonary:     Effort: Pulmonary effort is normal.     Breath sounds: Normal breath sounds.  Abdominal:     General: Abdomen is flat.     Palpations: Abdomen is soft.     Tenderness: There is no abdominal tenderness.  Musculoskeletal:        General: No swelling. Normal range of motion.     Cervical back: Neck supple.  Skin:    General: Skin is warm and dry.  Neurological:     General: No focal deficit present.     Mental Status: She is  alert.  Psychiatric:        Mood and Affect: Mood normal.     ED Results / Procedures / Treatments   EKG None  Procedures Procedures  Medications Ordered in the ED Medications  ketorolac (TORADOL) 30 MG/ML injection 30 mg (30 mg Intramuscular Given 08/24/21 0548)  prochlorperazine (COMPAZINE) injection 10 mg (10 mg Intramuscular Given 08/24/21 0548)    Initial Impression and Plan  Patient here with nonspecific headache. No red flags or concern for ICH or mass. Will give IM toradol/compazine and reassess.   ED Course   Clinical Course as of 08/24/21 08/26/21  3716 Aug 24, 2021  0620 Patient feeling better, sleeping soundly and comfortable going home. Recommend she stay hydrated. APAP/Motrin for headaches. Follow up with PCP if pain continues.  [CS]    Clinical Course User Index [CS] Aug 26, 2021, MD     MDM Rules/Calculators/A&P Medical Decision Making Problems Addressed: Acute nonintractable headache, unspecified headache type: acute illness or injury  Risk Prescription drug management.    Final Clinical Impression(s) / ED Diagnoses Final diagnoses:  Acute nonintractable headache, unspecified headache type    Rx / DC Orders ED Discharge Orders     None  Pollyann Savoy, MD 08/24/21 [phone]

## 2021-08-24 NOTE — ED Triage Notes (Signed)
Pt states HA x 3 days, Denies any N/V, denies fever No light or sound sensitivity  Tylenol last at 2000 yest with no relief

## 2022-01-01 ENCOUNTER — Encounter (HOSPITAL_BASED_OUTPATIENT_CLINIC_OR_DEPARTMENT_OTHER): Payer: Self-pay

## 2022-01-01 ENCOUNTER — Other Ambulatory Visit: Payer: Self-pay

## 2022-01-01 DIAGNOSIS — Z20822 Contact with and (suspected) exposure to covid-19: Secondary | ICD-10-CM | POA: Diagnosis not present

## 2022-01-01 DIAGNOSIS — J101 Influenza due to other identified influenza virus with other respiratory manifestations: Secondary | ICD-10-CM | POA: Insufficient documentation

## 2022-01-01 DIAGNOSIS — R509 Fever, unspecified: Secondary | ICD-10-CM | POA: Diagnosis present

## 2022-01-01 LAB — RESP PANEL BY RT-PCR (RSV, FLU A&B, COVID)  RVPGX2
Influenza A by PCR: POSITIVE — AB
Influenza B by PCR: NEGATIVE
Resp Syncytial Virus by PCR: NEGATIVE
SARS Coronavirus 2 by RT PCR: NEGATIVE

## 2022-01-01 NOTE — ED Triage Notes (Signed)
Patient arrives to ED POV c/o Fevers, cough and congestions x3 days. States she has been taking OTC medicine but has had no relief. Denies any SHOB. No other complaints at this time.

## 2022-01-02 ENCOUNTER — Emergency Department (HOSPITAL_BASED_OUTPATIENT_CLINIC_OR_DEPARTMENT_OTHER)
Admission: EM | Admit: 2022-01-02 | Discharge: 2022-01-02 | Disposition: A | Discharge status: Home / Self Care | Payer: BC Managed Care – PPO | Attending: Emergency Medicine | Admitting: Emergency Medicine

## 2022-01-02 DIAGNOSIS — J111 Influenza due to unidentified influenza virus with other respiratory manifestations: Secondary | ICD-10-CM

## 2022-01-02 NOTE — ED Notes (Signed)
Written and verbal inst to pt  Verbalized an understanding  To home  

## 2022-01-02 NOTE — ED Provider Notes (Signed)
   [facility] EMERGENCY DEPARTMENT  Provider Note  CSN: [id number] Arrival date & time: 01/01/22 2008  History Chief Complaint  Patient presents with   Cough    Amber Mathews is a 36 y.o. female with no PMH here with 3 days of URI symptoms, cough, fever, nasal congestion. No SOB. No known sick contacts.    Home Medications Prior to Admission medications   Medication Sig Start Date End Date Taking? Authorizing Provider  ketorolac (ACULAR) 0.5 % ophthalmic solution Place 1 drop into both eyes every 6 (six) hours. 05/19/21   Molpus, Jonny Ruiz, MD     Allergies    Patient has no known allergies.   Review of Systems   Review of Systems Please see HPI for pertinent positives and negatives  Physical Exam BP 121/89   Pulse 71   Temp 98.1 F (36.7 C) (Oral)   Resp 14   Ht 5\' 5"  (1.651 m)   Wt 74.8 kg   SpO2 99%   BMI 27.46 kg/m   Physical Exam Vitals and nursing note reviewed.  Constitutional:      Appearance: Normal appearance.  HENT:     Head: Normocephalic and atraumatic.     Nose: Nose normal.     Mouth/Throat:     Mouth: Mucous membranes are moist.  Eyes:     Extraocular Movements: Extraocular movements intact.     Conjunctiva/sclera: Conjunctivae normal.  Cardiovascular:     Rate and Rhythm: Normal rate.  Pulmonary:     Effort: Pulmonary effort is normal.     Breath sounds: Normal breath sounds. No wheezing or rales.  Abdominal:     General: Abdomen is flat.     Palpations: Abdomen is soft.     Tenderness: There is no abdominal tenderness.  Musculoskeletal:        General: No swelling. Normal range of motion.     Cervical back: Neck supple.  Skin:    General: Skin is warm and dry.  Neurological:     General: No focal deficit present.     Mental Status: She is alert.  Psychiatric:        Mood and Affect: Mood normal.     ED Results / Procedures / Treatments   EKG None  Procedures Procedures  Medications Ordered in the  ED Medications - No data to display  Initial Impression and Plan  Patient here with viral URI, flu is positive. She is otherwise well appearing with reassuring vitals and exam. Recommend OTC symptomatic care, oral hydration and rest.   ED Course       MDM Rules/Calculators/A&P Medical Decision Making Problems Addressed: Influenza: acute illness or injury  Amount and/or Complexity of Data Reviewed Labs: ordered. Decision-making details documented in ED Course.  Risk OTC drugs.    Final Clinical Impression(s) / ED Diagnoses Final diagnoses:  Influenza    Rx / DC Orders ED Discharge Orders     None        , MD 01/02/22 [phone]

## 2022-01-03 ENCOUNTER — Emergency Department (HOSPITAL_COMMUNITY): Payer: BC Managed Care – PPO

## 2022-01-03 ENCOUNTER — Emergency Department (HOSPITAL_COMMUNITY)
Admission: EM | Admit: 2022-01-03 | Discharge: 2022-01-03 | Disposition: A | Discharge status: Home / Self Care | Payer: BC Managed Care – PPO | Attending: Emergency Medicine | Admitting: Emergency Medicine

## 2022-01-03 DIAGNOSIS — S43014A Anterior dislocation of right humerus, initial encounter: Secondary | ICD-10-CM | POA: Diagnosis not present

## 2022-01-03 DIAGNOSIS — R569 Unspecified convulsions: Secondary | ICD-10-CM

## 2022-01-03 DIAGNOSIS — Y92410 Unspecified street and highway as the place of occurrence of the external cause: Secondary | ICD-10-CM | POA: Insufficient documentation

## 2022-01-03 DIAGNOSIS — S4991XA Unspecified injury of right shoulder and upper arm, initial encounter: Secondary | ICD-10-CM | POA: Diagnosis present

## 2022-01-03 LAB — CBC WITH DIFFERENTIAL/PLATELET
Abs Immature Granulocytes: 0.02 10*3/uL (ref 0.00–0.07)
Basophils Absolute: 0 10*3/uL (ref 0.0–0.1)
Basophils Relative: 0 %
Eosinophils Absolute: 0 10*3/uL (ref 0.0–0.5)
Eosinophils Relative: 1 %
HCT: 40.7 % (ref 36.0–46.0)
Hemoglobin: 13.8 g/dL (ref 12.0–15.0)
Immature Granulocytes: 0 %
Lymphocytes Relative: 29 %
Lymphs Abs: 1.7 10*3/uL (ref 0.7–4.0)
MCH: 29.6 pg (ref 26.0–34.0)
MCHC: 33.9 g/dL (ref 30.0–36.0)
MCV: 87.2 fL (ref 80.0–100.0)
Monocytes Absolute: 0.3 10*3/uL (ref 0.1–1.0)
Monocytes Relative: 6 %
Neutro Abs: 3.6 10*3/uL (ref 1.7–7.7)
Neutrophils Relative %: 64 %
Platelets: 215 10*3/uL (ref 150–400)
RBC: 4.67 MIL/uL (ref 3.87–5.11)
RDW: 12.2 % (ref 11.5–15.5)
WBC: 5.7 10*3/uL (ref 4.0–10.5)
nRBC: 0 % (ref 0.0–0.2)

## 2022-01-03 LAB — I-STAT CHEM 8, ED
BUN: 6 mg/dL (ref 6–20)
Calcium, Ion: 1.06 mmol/L — ABNORMAL LOW (ref 1.15–1.40)
Chloride: 104 mmol/L (ref 98–111)
Creatinine, Ser: 0.8 mg/dL (ref 0.44–1.00)
Glucose, Bld: 161 mg/dL — ABNORMAL HIGH (ref 70–99)
HCT: 41 % (ref 36.0–46.0)
Hemoglobin: 13.9 g/dL (ref 12.0–15.0)
Potassium: 3.9 mmol/L (ref 3.5–5.1)
Sodium: 138 mmol/L (ref 135–145)
TCO2: 23 mmol/L (ref 22–32)

## 2022-01-03 LAB — I-STAT BETA HCG BLOOD, ED (MC, WL, AP ONLY): I-stat hCG, quantitative: <5 m[IU]/mL (ref <5)

## 2022-01-03 LAB — COMPREHENSIVE METABOLIC PANEL
ALT: 19 U/L (ref 0–44)
AST: 30 U/L (ref 15–41)
Albumin: 3.6 g/dL (ref 3.5–5.0)
Alkaline Phosphatase: 64 U/L (ref 38–126)
Anion gap: 12 (ref 5–15)
BUN: 5 mg/dL — ABNORMAL LOW (ref 6–20)
CO2: 20 mmol/L — ABNORMAL LOW (ref 22–32)
Calcium: 8.8 mg/dL — ABNORMAL LOW (ref 8.9–10.3)
Chloride: 104 mmol/L (ref 98–111)
Creatinine, Ser: 0.94 mg/dL (ref 0.44–1.00)
GFR, Estimated: >60 mL/min (ref >60)
Glucose, Bld: 166 mg/dL — ABNORMAL HIGH (ref 70–99)
Potassium: 3.9 mmol/L (ref 3.5–5.1)
Sodium: 136 mmol/L (ref 135–145)
Total Bilirubin: 0.7 mg/dL (ref 0.3–1.2)
Total Protein: 6.3 g/dL — ABNORMAL LOW (ref 6.5–8.1)

## 2022-01-03 LAB — URINALYSIS, ROUTINE W REFLEX MICROSCOPIC
Bilirubin Urine: NEGATIVE
Glucose, UA: NEGATIVE mg/dL
Ketones, ur: 20 mg/dL — AB
Leukocytes,Ua: NEGATIVE
Nitrite: NEGATIVE
Protein, ur: 30 mg/dL — AB
Specific Gravity, Urine: 1.012 (ref 1.005–1.030)
pH: 7 (ref 5.0–8.0)

## 2022-01-03 LAB — ETHANOL: Alcohol, Ethyl (B): <10 mg/dL (ref <10)

## 2022-01-03 LAB — RAPID URINE DRUG SCREEN, HOSP PERFORMED
Amphetamines: NOT DETECTED
Barbiturates: NOT DETECTED
Benzodiazepines: NOT DETECTED
Cocaine: NOT DETECTED
Opiates: POSITIVE — AB
Tetrahydrocannabinol: NOT DETECTED

## 2022-01-03 LAB — CBG MONITORING, ED: Glucose-Capillary: 153 mg/dL — ABNORMAL HIGH (ref 70–99)

## 2022-01-03 MED ORDER — SODIUM CHLORIDE 0.9 % IV BOLUS
500.0000 mL | Freq: Once | INTRAVENOUS | Status: AC
  Administered 2022-01-03: 500 mL via INTRAVENOUS

## 2022-01-03 MED ORDER — LEVETIRACETAM 500 MG PO TABS: 500.0000 mg | ORAL_TABLET | Freq: Two times a day (BID) | ORAL | 1 refills | Status: DC

## 2022-01-03 MED ORDER — SODIUM CHLORIDE 0.9 % IV SOLN
2500.0000 mg | Freq: Once | INTRAVENOUS | Status: AC
  Administered 2022-01-03: 2500 mg via INTRAVENOUS
  Filled 2022-01-03: qty 25

## 2022-01-03 MED ORDER — MORPHINE SULFATE (PF) 4 MG/ML IV SOLN
4.0000 mg | Freq: Once | INTRAVENOUS | Status: AC
  Administered 2022-01-03: 4 mg via INTRAVENOUS
  Filled 2022-01-03: qty 1
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
## 2022-01-03 NOTE — ED Notes (Signed)
Dr. Rodena Medin at bedside. Pt medicated with Morphine and reports pain being better. Pt shoulder reduced by Dr. Rodena Medin without difficulty. Xray called for repeat post-reduction xray. Pt reports right shoulder feeling much better. Pt right arm placed in sling and in position of most comfort.
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
## 2022-01-03 NOTE — Discharge Instructions (Addendum)
Return for any problem.   Take Keppra as prescribed.  Follow-up closely with outpatient neurology as instructed.  Avoid high risk activities until your seizures are well-controlled.  High risk activities include driving or operating heavy machinery.  Follow-up with Dr. Susa Simmonds (Orthopedics) regarding your right shoulder dislocation.  Use sling as instructed for pain and comfort.

## 2022-02-12 ENCOUNTER — Encounter: Payer: Self-pay | Admitting: Neurology

## 2022-02-12 ENCOUNTER — Telehealth: Payer: Self-pay | Admitting: Neurology

## 2022-02-12 ENCOUNTER — Ambulatory Visit (INDEPENDENT_AMBULATORY_CARE_PROVIDER_SITE_OTHER): Payer: BC Managed Care – PPO | Admitting: Neurology

## 2022-02-12 VITALS — BP 115/78 | HR 89 | Ht 65.0 in | Wt 153.0 lb

## 2022-02-12 DIAGNOSIS — G40909 Epilepsy, unspecified, not intractable, without status epilepticus: Secondary | ICD-10-CM | POA: Diagnosis not present

## 2022-02-12 MED ORDER — LEVETIRACETAM 500 MG PO TABS: 500.0000 mg | ORAL_TABLET | Freq: Two times a day (BID) | ORAL | 11 refills | Status: AC

## 2022-02-12 NOTE — Patient Instructions (Signed)
Start Keppra 5600 mg twice daily  Routine EEG  Driving restriction for the next 6 months  Return in 6 months or sooner if worse

## 2022-02-12 NOTE — Telephone Encounter (Signed)
OK to get shoulder MRI

## 2022-02-12 NOTE — Telephone Encounter (Signed)
Patient had an appointment today and said she forgot to ask if it is ok for her to get a MRI of her shoulder because she had dislocated it. Her ortho doc will order the MRI but he wanted neuro to say its ok due to her seizures
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
## 2022-02-26 ENCOUNTER — Ambulatory Visit (INDEPENDENT_AMBULATORY_CARE_PROVIDER_SITE_OTHER): Payer: BC Managed Care – PPO | Admitting: Neurology

## 2022-02-26 DIAGNOSIS — G40909 Epilepsy, unspecified, not intractable, without status epilepticus: Secondary | ICD-10-CM | POA: Diagnosis not present

## 2022-02-26 NOTE — Procedures (Signed)
    History:  37 year old woman with seizure disorder   EEG classification:  Awake and asleep  Description of the recording: The background rhythms of this recording consists of a fairly well modulated medium amplitude beta activity. As the record progresses, the patient initially is in the waking state, but appears to enter the early stage II sleep during the recording, with rudimentary sleep spindles and vertex sharp wave activity seen. During the wakeful state, photic stimulation is performed, and no abnormal responses were seen. Hyperventilation was also performed, no abnormal response seen. There were presence of generalized sharp and slow wave discharges. There was no focal slowing.   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         Alric Ran, MD [facility]

## 2022-07-27 ENCOUNTER — Emergency Department (HOSPITAL_BASED_OUTPATIENT_CLINIC_OR_DEPARTMENT_OTHER)
Admission: EM | Admit: 2022-07-27 | Discharge: 2022-07-27 | Disposition: A | Discharge status: Home / Self Care | Payer: BC Managed Care – PPO | Source: Home / Self Care | Attending: Emergency Medicine | Admitting: Emergency Medicine

## 2022-07-27 ENCOUNTER — Other Ambulatory Visit: Payer: Self-pay

## 2022-07-27 DIAGNOSIS — R519 Headache, unspecified: Secondary | ICD-10-CM | POA: Insufficient documentation

## 2022-07-27 MED ORDER — SODIUM CHLORIDE 0.9 % IV BOLUS
500.0000 mL | Freq: Once | INTRAVENOUS | Status: AC
  Administered 2022-07-27: 500 mL via INTRAVENOUS

## 2022-07-27 MED ORDER — KETOROLAC TROMETHAMINE 15 MG/ML IJ SOLN
15.0000 mg | Freq: Once | INTRAMUSCULAR | Status: AC
  Administered 2022-07-27: 15 mg via INTRAVENOUS
  Filled 2022-07-27: qty 1

## 2022-07-27 MED ORDER — DIPHENHYDRAMINE HCL 50 MG/ML IJ SOLN
25.0000 mg | Freq: Once | INTRAMUSCULAR | Status: AC
  Administered 2022-07-27: 25 mg via INTRAVENOUS
  Filled 2022-07-27: qty 1

## 2022-07-27 MED ORDER — METOCLOPRAMIDE HCL 5 MG/ML IJ SOLN
10.0000 mg | Freq: Once | INTRAMUSCULAR | Status: AC
  Administered 2022-07-27: 10 mg via INTRAVENOUS
  Filled 2022-07-27: qty 2

## 2022-07-27 NOTE — ED Triage Notes (Signed)
Patient presents to ED via POV from home. Here with headache/migraine. Reports history of same. Denies being on medication. Reports symptoms began this morning.

## 2022-07-27 NOTE — ED Provider Notes (Addendum)
[facility] [place] [facility] Provider Note   CSN: [id number] Arrival date & time: 07/27/22  1137     History  Chief Complaint  Patient presents with   Headache    Amber Mathews is a 37 y.o. female.  Patient with history of epilepsy on Keppra presents to the emergency department for evaluation of headache.  Patient states that she awoke around 2 AM today with a headache.  She took an extra strength Tylenol.  Headache is frontal and sharp.  It is similar in nature to previous headaches however more severe and associate with vomiting 1 time this morning.  She tried [place] powder as well without improvement.  She denies preceding head injury.  She denies neck pain or fever.  No confusion.  He is ambulating well.  She has associated light and sound sensitivity.  She wonders if the Keppra could be causing increased headaches and states that she plans to talk with her neurologist about this during an appointment scheduled for August. Currently on driving restrictions.        Home Medications Prior to Admission medications   Medication Sig Start Date End Date Taking? Authorizing Provider  levETIRAcetam (KEPPRA) 500 MG tablet Take 1 tablet (500 mg total) by mouth 2 (two) times daily. 02/12/22   Windell Norfolk, MD      Allergies    Patient has no known allergies.    Review of Systems   Review of Systems  Physical Exam Updated Vital Signs BP (!) 125/92 (BP Location: Left Arm)   Pulse 75   Temp 98 F (36.7 C) (Oral)   Resp 16   Ht 5\' 5"  (1.651 m)   Wt 74.8 kg   SpO2 100%   BMI 27.46 kg/m  Physical Exam Vitals and nursing note reviewed.  Constitutional:      Appearance: She is well-developed.  HENT:     Head: Normocephalic and atraumatic.     Right Ear: Tympanic membrane, ear canal and external ear normal.     Left Ear: Tympanic membrane, ear canal and external ear normal.     Nose: Nose normal.     Mouth/Throat:     Pharynx: Uvula midline.   Eyes:     General: Lids are normal.     Extraocular Movements:     Right eye: No nystagmus.     Left eye: No nystagmus.     Conjunctiva/sclera: Conjunctivae normal.     Pupils: Pupils are equal, round, and reactive to light.  Neck:     Comments: No signs of meningismus. Cardiovascular:     Rate and Rhythm: Normal rate and regular rhythm.  Pulmonary:     Effort: Pulmonary effort is normal.     Breath sounds: Normal breath sounds.  Abdominal:     Palpations: Abdomen is soft.     Tenderness: There is no abdominal tenderness.  Musculoskeletal:     Cervical back: Normal range of motion and neck supple. No tenderness or bony tenderness.  Skin:    General: Skin is warm and dry.  Neurological:     Mental Status: She is alert and oriented to person, place, and time.     GCS: GCS eye subscore is 4. GCS verbal subscore is 5. GCS motor subscore is 6.     Cranial Nerves: No cranial nerve deficit.     Sensory: No sensory deficit.     Motor: No weakness.     Coordination: Coordination normal.  Gait: Gait normal.     Comments: Upper extremity myotomes tested bilaterally:  C5 Shoulder abduction 5/5 C6 Elbow flexion/wrist extension 5/5 C7 Elbow extension 5/5 C8 Finger flexion 5/5 T1 Finger abduction 5/5  Lower extremity myotomes tested bilaterally: L2 Hip flexion 5/5 L3 Knee extension 5/5 L4 Ankle dorsiflexion 5/5 S1 Ankle plantar flexion 5/5      ED Results / Procedures / Treatments   Labs (all labs ordered are listed, but only abnormal results are displayed) Labs Reviewed - No data to display  EKG None  Radiology No results found.  Procedures Procedures    Medications Ordered in ED Medications  metoCLOPramide (REGLAN) injection 10 mg (has no administration in time range)  diphenhydrAMINE (BENADRYL) injection 25 mg (has no administration in time range)  ketorolac (TORADOL) 15 MG/ML injection 15 mg (has no administration in time range)    ED Course/ Medical  Decision Making/ A&P    Patient seen and examined. History obtained directly from patient.  I reviewed patient's previous neurology notes as well as EEG report.  Labs/EKG: None ordered  Imaging: None ordered.  Most recent head imaging was 01/03/2022, negative CT head without contrast.  Medications/Fluids: Ordered: IV Reglan, Benadryl, Toradol.  IV fluid bolus.  Most recent vital signs reviewed and are as follows: BP (!) 125/92 (BP Location: Left Arm)   Pulse 75   Temp 98 F (36.7 C) (Oral)   Resp 16   Ht 5\' 5"  (1.651 m)   Wt 74.8 kg   SpO2 100%   BMI 27.46 kg/m   Initial impression: Acute headache without red flags.  3:20 PM Reassessment performed. Patient appears stable. She states she is feeling better. Offered dc vs continued observation and she would like to go home to get some rest.   Questions answered.   Most current vital signs reviewed and are as follows: BP (!) 125/92 (BP Location: Left Arm)   Pulse 75   Temp 98 F (36.7 C) (Oral)   Resp 16   Ht 5\' 5"  (1.651 m)   Wt 74.8 kg   SpO2 100%   BMI 27.46 kg/m   Plan: Discharge to home.   Prescriptions written for: None  Other home care instructions discussed: Avoidance of triggers  ED return instructions discussed: Worsening severe pain, persistent vomiting, stroke-like sx.   Follow-up instructions discussed: Patient encouraged to follow-up with their PCP as needed.                            Medical Decision Making Risk Prescription drug management.   In regards to the patient's headache, critical differentials were considered including subarachnoid hemorrhage, intracerebral hemorrhage, epidural/subdural hematoma, pituitary apoplexy, vertebral/carotid artery dissection, giant cell arteritis, central venous thrombosis, reversible cerebral vasoconstriction, acute angle closure glaucoma, idiopathic intracranial hypertension, bacterial meningitis, viral encephalitis, carbon monoxide poisoning, posterior  reversible encephalopathy syndrome, pre-eclampsia.   Reg flag symptoms related to these causes were considered including systemic symptoms (fever, weight loss), neurologic symptoms (confusion, mental status change, vision change, associated seizure), acute or sudden "thunderclap" onset, patient age 65 or older with new or progressive headache, patient of any age with first headache or change in headache pattern, pregnant or postpartum status, history of HIV or other immunocompromise, history of cancer, headache occurring with exertion, associated neck or shoulder pain, associated traumatic injury, concurrent use of anticoagulation, family history of spontaneous SAH, and concurrent drug use.    Other benign, more common causes of headache  were considered including migraine, tension-type headache, cluster headache, referred pain from other cause such as sinus infection, dental pain, trigeminal neuralgia.   On exam, patient has a reassuring neuro exam including baseline mental status, no significant neck pain or meningeal signs, no signs of severe infection or fever.   The patient's vital signs, pertinent lab work and imaging were reviewed and interpreted as discussed in the ED course. Hospitalization was considered for further testing, treatments, or serial exams/observation. However as patient is well-appearing, has a stable exam over the course of their evaluation, and reassuring studies today, I do not feel that they warrant admission at this time. This plan was discussed with the patient who verbalizes agreement and comfort with this plan and seems reliable and able to return to the Emergency Department with worsening or changing symptoms.          Final Clinical Impression(s) / ED Diagnoses Final diagnoses:  Acute nonintractable headache, unspecified headache type    Rx / DC Orders ED Discharge Orders     None         Renne Crigler, PA-C 07/27/22 1522    Renne Crigler,  PA-C 07/27/22 1533    Laurence Spates, MD 07/28/22 [phone]

## 2022-07-27 NOTE — Discharge Instructions (Signed)
Please read and follow all provided instructions.  Your diagnoses today include:  1. Acute nonintractable headache, unspecified headache type     Tests performed today include: Vital signs. See below for your results today.   Medications:  In the Emergency Department you received: Reglan - antinausea/headache medication Benadryl - antihistamine to counteract potential side effects of reglan Toradol - NSAID medication similar to ibuprofen  Take any prescribed medications only as directed.  Additional information:  Follow any educational materials contained in this packet.  You are having a headache. No specific cause was found today for your headache. It may have been a migraine or other cause of headache. Stress, anxiety, fatigue, and depression are common triggers for headaches.   Your headache today does not appear to be life-threatening or require hospitalization, but often the exact cause of headaches is not determined in the emergency department. Therefore, follow-up with your doctor is very important to find out what may have caused your headache and whether or not you need any further diagnostic testing or treatment.   Sometimes headaches can appear benign (not harmful), but then more serious symptoms can develop which should prompt an immediate re-evaluation by your doctor or the emergency department.  BE VERY CAREFUL not to take multiple medicines containing Tylenol (also called acetaminophen). Doing so can lead to an overdose which can damage your liver and cause liver failure and possibly death.   Follow-up instructions: Please follow-up with your primary care provider in the next 3 days for further evaluation of your symptoms.   Return instructions:  Please return to the Emergency Department if you experience worsening symptoms. Return if the medications do not resolve your headache, if it recurs, or if you have multiple episodes of vomiting or cannot keep down  fluids. Return if you have a change from the usual headache. RETURN IMMEDIATELY IF you: Develop a sudden, severe headache Develop confusion or become poorly responsive or faint Develop a fever above 100.82F or problem breathing Have a change in speech, vision, swallowing, or understanding Develop new weakness, numbness, tingling, incoordination in your arms or legs Have a seizure Please return if you have any other emergent concerns.  Additional Information:  Your vital signs today were: BP (!) 125/92 (BP Location: Left Arm)   Pulse 75   Temp 98 F (36.7 C) (Oral)   Resp 16   Ht 5\' 5"  (1.651 m)   Wt 74.8 kg   SpO2 100%   BMI 27.46 kg/m  If your blood pressure (BP) was elevated above 135/85 this visit, please have this repeated by your doctor within one month. --------------

## 2022-08-20 ENCOUNTER — Ambulatory Visit: Payer: BC Managed Care – PPO | Admitting: Neurology

## 2022-08-20 ENCOUNTER — Encounter: Payer: Self-pay | Admitting: Neurology

## 2022-10-04 ENCOUNTER — Other Ambulatory Visit: Payer: Self-pay

## 2022-10-04 ENCOUNTER — Emergency Department (HOSPITAL_COMMUNITY): Payer: BC Managed Care – PPO

## 2022-10-04 ENCOUNTER — Emergency Department (HOSPITAL_COMMUNITY)
Admission: EM | Admit: 2022-10-04 | Discharge: 2022-10-04 | Disposition: A | Discharge status: Home / Self Care | Payer: BC Managed Care – PPO | Attending: Emergency Medicine | Admitting: Emergency Medicine

## 2022-10-04 DIAGNOSIS — M62838 Other muscle spasm: Secondary | ICD-10-CM | POA: Insufficient documentation

## 2022-10-04 DIAGNOSIS — M25511 Pain in right shoulder: Secondary | ICD-10-CM | POA: Diagnosis present

## 2022-10-04 MED ORDER — METHOCARBAMOL 500 MG PO TABS: 500.0000 mg | ORAL_TABLET | Freq: Two times a day (BID) | ORAL | 0 refills | Status: DC

## 2022-10-04 MED ORDER — DIAZEPAM 2 MG PO TABS
2.0000 mg | ORAL_TABLET | Freq: Once | ORAL | Status: AC
  Administered 2022-10-04: 2 mg via ORAL
  Filled 2022-10-04: qty 1

## 2022-10-04 MED ORDER — KETOROLAC TROMETHAMINE 15 MG/ML IJ SOLN
15.0000 mg | Freq: Once | INTRAMUSCULAR | Status: AC
  Administered 2022-10-04: 15 mg via INTRAMUSCULAR
  Filled 2022-10-04: qty 1

## 2022-10-04 MED ORDER — LIDOCAINE 5 % EX PTCH
1.0000 | MEDICATED_PATCH | CUTANEOUS | Status: DC
  Administered 2022-10-04: 1 via TRANSDERMAL
  Filled 2022-10-04: qty 1

## 2022-10-04 NOTE — Discharge Instructions (Addendum)
Like to have a muscle spasm.  I have sent Robaxin into the pharmacy for you.  This will make you drowsy.  Do not drive or do anything else that is dangerous after taking this medication.  You received a couple medications in the emergency department as well.  No evidence of dislocation or fracture on x-ray.

## 2022-10-04 NOTE — ED Provider Notes (Signed)
[place] EMERGENCY DEPARTMENT AT [facility] Provider Note   CSN: [id number] Arrival date & time: 10/04/22  1534     History  Chief Complaint  Patient presents with   Shoulder Injury    Amber Mathews is a 37 y.o. female.  37 year old female presents today for concern of right shoulder pain.  She states she fell from a stepladder last night.  She states she went to bed and woke up with worsening pain.  She states she has history of shoulder dislocation is concerned maybe it is dislocated again.  Denies head injury or loss of consciousness.  Has not taken anything prior to arrival for this.  The history is provided by the patient. No language interpreter was used.       Home Medications Prior to Admission medications   Medication Sig Start Date End Date Taking? Authorizing Provider  levETIRAcetam (KEPPRA) 500 MG tablet Take 1 tablet (500 mg total) by mouth 2 (two) times daily. 02/12/22   Windell Norfolk, MD      Allergies    Patient has no known allergies.    Review of Systems   Review of Systems  Constitutional:  Negative for fever.  Musculoskeletal:  Positive for arthralgias. Negative for joint swelling.  All other systems reviewed and are negative.   Physical Exam Updated Vital Signs BP 120/89   Pulse 80   Temp 98.3 F (36.8 C) (Oral)   Resp 16   SpO2 100%  Physical Exam Vitals and nursing note reviewed.  Constitutional:      General: She is not in acute distress.    Appearance: Normal appearance. She is not ill-appearing.  HENT:     Head: Normocephalic and atraumatic.     Nose: Nose normal.  Eyes:     Conjunctiva/sclera: Conjunctivae normal.  Cardiovascular:     Rate and Rhythm: Normal rate.  Pulmonary:     Effort: Pulmonary effort is normal. No respiratory distress.  Musculoskeletal:        General: No deformity. Normal range of motion.     Comments: Good active and passive range of motion of the right shoulder.  No visible deformity  or joint swelling.  Tightness to muscles in the posterior shoulder noted on the right.  Consistent with muscle spasm.  Worse with cervical range of motion.  Cervical spine without tenderness to palpation.  Neurovascularly intact in the right upper extremity.  Skin:    Findings: No rash.  Neurological:     Mental Status: She is alert.     ED Results / Procedures / Treatments   Labs (all labs ordered are listed, but only abnormal results are displayed) Labs Reviewed - No data to display  EKG None  Radiology DG Shoulder Right  Result Date: 10/04/2022 CLINICAL DATA:  Pain and deformity of the right shoulder after falling from a ladder. EXAM: RIGHT SHOULDER - 2+ VIEW COMPARISON:  Shoulder radiographs dated 01/03/2022. FINDINGS: There is no evidence of fracture or dislocation. There is no evidence of arthropathy or other focal bone abnormality. Soft tissues are unremarkable. IMPRESSION: Negative. Electronically Signed   By: Romona Curls M.D.   On: 10/04/2022 16:23    Procedures Procedures    Medications Ordered in ED Medications  diazepam (VALIUM) tablet 2 mg (has no administration in time range)  ketorolac (TORADOL) 15 MG/ML injection 15 mg (has no administration in time range)  lidocaine (LIDODERM) 5 % 1 patch (has no administration in time range)  ED Course/ Medical Decision Making/ A&P                                 Medical Decision Making Amount and/or Complexity of Data Reviewed Radiology: ordered.  Risk Prescription drug management.   37 year old female presents today for complaint of right shoulder pain.  No visible deformity.  Neurovascularly intact.  Exam consistent with muscle spasm.  Will give Valium and Toradol, as well as lidocaine patch.  Will discharge with Robaxin.  Discussed taking ibuprofen for pain control.  X-ray obtained.  No evidence of fracture or dislocation.  Patient voices understanding and is in agreement with plan.   Final Clinical  Impression(s) / ED Diagnoses Final diagnoses:  Muscle spasm of right shoulder    Rx / DC Orders ED Discharge Orders          Ordered    methocarbamol (ROBAXIN) 500 MG tablet  2 times daily        10/04/22 1658              Marita Kansas, PA-C 10/04/22 1659    Horton, Clabe Seal, DO 10/04/22 2338

## 2022-10-04 NOTE — ED Triage Notes (Signed)
Patient arrives with pain and deformity to R shoulder after falling off a step ladder last night while hanging curtains. No other injuries. Hx of shoulder dislocation once before.

## 2023-01-09 ENCOUNTER — Encounter (HOSPITAL_BASED_OUTPATIENT_CLINIC_OR_DEPARTMENT_OTHER): Payer: Self-pay | Admitting: Emergency Medicine

## 2023-01-09 ENCOUNTER — Emergency Department (HOSPITAL_BASED_OUTPATIENT_CLINIC_OR_DEPARTMENT_OTHER): Payer: BC Managed Care – PPO

## 2023-01-09 ENCOUNTER — Emergency Department (HOSPITAL_BASED_OUTPATIENT_CLINIC_OR_DEPARTMENT_OTHER)
Admission: EM | Admit: 2023-01-09 | Discharge: 2023-01-09 | Disposition: A | Discharge status: Home / Self Care | Payer: BC Managed Care – PPO | Attending: Emergency Medicine | Admitting: Emergency Medicine

## 2023-01-09 DIAGNOSIS — Z20822 Contact with and (suspected) exposure to covid-19: Secondary | ICD-10-CM | POA: Insufficient documentation

## 2023-01-09 DIAGNOSIS — R059 Cough, unspecified: Secondary | ICD-10-CM | POA: Diagnosis present

## 2023-01-09 DIAGNOSIS — R0789 Other chest pain: Secondary | ICD-10-CM | POA: Diagnosis not present

## 2023-01-09 DIAGNOSIS — J029 Acute pharyngitis, unspecified: Secondary | ICD-10-CM

## 2023-01-09 DIAGNOSIS — J069 Acute upper respiratory infection, unspecified: Secondary | ICD-10-CM | POA: Diagnosis not present

## 2023-01-09 LAB — BASIC METABOLIC PANEL
Anion gap: 7 (ref 5–15)
BUN: 11 mg/dL (ref 6–20)
CO2: 25 mmol/L (ref 22–32)
Calcium: 9.1 mg/dL (ref 8.9–10.3)
Chloride: 104 mmol/L (ref 98–111)
Creatinine, Ser: 0.93 mg/dL (ref 0.44–1.00)
GFR, Estimated: >60 mL/min (ref >60)
Glucose, Bld: 92 mg/dL (ref 70–99)
Potassium: 3.8 mmol/L (ref 3.5–5.1)
Sodium: 136 mmol/L (ref 135–145)

## 2023-01-09 LAB — PREGNANCY, URINE: Preg Test, Ur: NEGATIVE

## 2023-01-09 LAB — CBC
HCT: 39.3 % (ref 36.0–46.0)
Hemoglobin: 13.1 g/dL (ref 12.0–15.0)
MCH: 29.2 pg (ref 26.0–34.0)
MCHC: 33.3 g/dL (ref 30.0–36.0)
MCV: 87.5 fL (ref 80.0–100.0)
Platelets: 310 10*3/uL (ref 150–400)
RBC: 4.49 MIL/uL (ref 3.87–5.11)
RDW: 12.2 % (ref 11.5–15.5)
WBC: 6.1 10*3/uL (ref 4.0–10.5)
nRBC: 0 % (ref 0.0–0.2)

## 2023-01-09 LAB — RESP PANEL BY RT-PCR (RSV, FLU A&B, COVID)  RVPGX2
Influenza A by PCR: NEGATIVE
Influenza B by PCR: NEGATIVE
Resp Syncytial Virus by PCR: NEGATIVE
SARS Coronavirus 2 by RT PCR: NEGATIVE

## 2023-01-09 LAB — TROPONIN I (HIGH SENSITIVITY): Troponin I (High Sensitivity): <2 ng/L (ref <18)

## 2023-01-09 LAB — GROUP A STREP BY PCR: Group A Strep by PCR: NOT DETECTED

## 2023-01-09 MED ORDER — BENZONATATE 100 MG PO CAPS: 100.0000 mg | ORAL_CAPSULE | Freq: Three times a day (TID) | ORAL | 0 refills | Status: DC | PRN

## 2023-01-09 MED ORDER — KETOROLAC TROMETHAMINE 15 MG/ML IJ SOLN
15.0000 mg | Freq: Once | INTRAMUSCULAR | Status: AC
  Administered 2023-01-09: 15 mg via INTRAVENOUS
  Filled 2023-01-09: qty 1

## 2023-01-09 MED ORDER — BENZONATATE 100 MG PO CAPS
200.0000 mg | ORAL_CAPSULE | Freq: Once | ORAL | Status: AC
  Administered 2023-01-09: 200 mg via ORAL
  Filled 2023-01-09: qty 2

## 2023-01-09 MED ORDER — LIDOCAINE VISCOUS HCL 2 % MT SOLN
15.0000 mL | Freq: Once | OROMUCOSAL | Status: AC
  Administered 2023-01-09: 15 mL via OROMUCOSAL
  Filled 2023-01-09: qty 15
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
## 2023-01-09 NOTE — ED Triage Notes (Signed)
 Chest and throat pain, with congestion and cough X 1 week.

## 2023-01-09 NOTE — ED Provider Notes (Signed)
 [place] EMERGENCY DEPARTMENT AT [facility] Provider Note   CSN: [id number] Arrival date & time: 01/09/23  0510     History  Chief Complaint  Patient presents with   Sore Throat   Cough   Chest Pain    Amber Mathews is a 38 y.o. female.  The history is provided by the patient.  Sore Throat Associated symptoms include chest pain.  Cough Associated symptoms: chest pain   Chest Pain Associated symptoms: cough   Amber Mathews is a 38 y.o. female who presents to the Emergency Department complaining of chest pain and cough.  She presents to the emergency department for evaluation of symptoms that started 1 week ago.  She initially started with sore throat that quickly progressed to central chest pain described as a constant pain and soreness.  Pain is nonradiating.  She has associated cough productive of mucus.  No fever, sneeze, runny nose, abdominal pain, vomiting, leg swelling or pain.  She has a remote history of seizures but is not on seizure medications.  No tobacco, alcohol, drug use.  No prescription medications.      Home Medications Prior to Admission medications   Medication Sig Start Date End Date Taking? Authorizing Provider  benzonatate  (TESSALON ) 100 MG capsule Take 1 capsule (100 mg total) by mouth 3 (three) times daily as needed for cough. 01/09/23  Yes Griselda Norris, MD  levETIRAcetam  (KEPPRA ) 500 MG tablet Take 1 tablet (500 mg total) by mouth 2 (two) times daily. 02/12/22   Camara, Amadou, MD  methocarbamol  (ROBAXIN ) 500 MG tablet Take 1 tablet (500 mg total) by mouth 2 (two) times daily. 10/04/22   Hildegard Loge, PA-C      Allergies    Patient has no known allergies.    Review of Systems   Review of Systems  Respiratory:  Positive for cough.   Cardiovascular:  Positive for chest pain.  All other systems reviewed and are negative.   Physical Exam Updated Vital Signs BP (!) 141/86 (BP Location: Right Arm)   Pulse 76   Temp 98.5  F (36.9 C) (Oral)   Resp 20   Ht 5' 5 (1.651 m)   Wt 72.6 kg   LMP 12/28/2022   SpO2 100%   BMI 26.63 kg/m  Physical Exam Vitals and nursing note reviewed.  Constitutional:      Appearance: She is well-developed.  HENT:     Head: Normocephalic and atraumatic.  Cardiovascular:     Rate and Rhythm: Normal rate and regular rhythm.     Heart sounds: No murmur heard. Pulmonary:     Effort: Pulmonary effort is normal. No respiratory distress.     Breath sounds: Normal breath sounds.  Abdominal:     Palpations: Abdomen is soft.     Tenderness: There is no abdominal tenderness. There is no guarding or rebound.  Musculoskeletal:        General: No tenderness.  Skin:    General: Skin is warm and dry.  Neurological:     Mental Status: She is alert and oriented to person, place, and time.  Psychiatric:        Behavior: Behavior normal.     ED Results / Procedures / Treatments   Labs (all labs ordered are listed, but only abnormal results are displayed) Labs Reviewed  GROUP A STREP BY PCR  RESP PANEL BY RT-PCR (RSV, FLU A&B, COVID)  [id number]  BASIC METABOLIC PANEL  CBC  PREGNANCY, URINE  TROPONIN  I (HIGH SENSITIVITY)    EKG EKG Interpretation Date/Time:  Saturday January 09 2023 05:26:19 EST Ventricular Rate:  75 PR Interval:  166 QRS Duration:  102 QT Interval:  369 QTC Calculation: 413 R Axis:   47  Text Interpretation: Sinus arrhythmia Confirmed by Griselda Norris [phone]) on 01/09/2023 5:29:50 AM  Radiology DG Chest 2 View Result Date: 01/09/2023 CLINICAL DATA:  Chest and throat pain with congestion and cough for 1 week EXAM: CHEST - 2 VIEW COMPARISON:  01/03/2022 FINDINGS: Artifact from EKG leads. Normal heart size and mediastinal contours. No acute infiltrate or edema. No effusion or pneumothorax. No acute osseous findings. IMPRESSION: No active cardiopulmonary disease. Electronically Signed   By: Dorn Roulette M.D.   On: 01/09/2023 06:43     Procedures Procedures    Medications Ordered in ED Medications  ketorolac  (TORADOL ) 15 MG/ML injection 15 mg (15 mg Intravenous Given 01/09/23 0611)  benzonatate  (TESSALON ) capsule 200 mg (200 mg Oral Given 01/09/23 0611)  lidocaine  (XYLOCAINE ) 2 % viscous mouth solution 15 mL (15 mLs Mouth/Throat Given 01/09/23 9388)    ED Course/ Medical Decision Making/ A&P                                 Medical Decision Making Amount and/or Complexity of Data Reviewed Labs: ordered. Radiology: ordered.  Risk Prescription drug management.   Patient here for evaluation of sore throat, chest pain and cough.  She is nontoxic-appearing on examination with no respiratory distress.  Exam is not consistent with RPA, PTA, epiglottitis.  EKG is nonischemic.  Troponin is negative.  Chest x-ray is negative for pneumonia.  Presentation is not consistent with PE, PERC negative.  Pain is improved after ketorolac  administration.  Suspect patient has viral URI.  Discussed home care with OTC analgesics.  Will prescribe as needed cough medication.  Discussed outpatient follow-up as well as return precautions.  Presentation is not consistent with ACS, dissection, myopericarditis.        Final Clinical Impression(s) / ED Diagnoses Final diagnoses:  Viral URI with cough  Sore throat  Atypical chest pain    Rx / DC Orders ED Discharge Orders          Ordered    benzonatate  (TESSALON ) 100 MG capsule  3 times daily PRN        01/09/23 0709              Griselda Norris, MD 01/09/23 [phone]

## 2023-02-25 ENCOUNTER — Encounter (HOSPITAL_COMMUNITY): Payer: Self-pay | Admitting: Emergency Medicine

## 2023-02-25 ENCOUNTER — Emergency Department (HOSPITAL_COMMUNITY)
Admission: EM | Admit: 2023-02-25 | Discharge: 2023-02-25 | Disposition: A | Discharge status: Home / Self Care | Payer: BC Managed Care – PPO | Attending: Emergency Medicine | Admitting: Emergency Medicine

## 2023-02-25 ENCOUNTER — Emergency Department (HOSPITAL_COMMUNITY): Payer: BC Managed Care – PPO

## 2023-02-25 DIAGNOSIS — S4991XA Unspecified injury of right shoulder and upper arm, initial encounter: Secondary | ICD-10-CM | POA: Diagnosis present

## 2023-02-25 DIAGNOSIS — S46911A Strain of unspecified muscle, fascia and tendon at shoulder and upper arm level, right arm, initial encounter: Secondary | ICD-10-CM | POA: Diagnosis not present

## 2023-02-25 DIAGNOSIS — X509XXA Other and unspecified overexertion or strenuous movements or postures, initial encounter: Secondary | ICD-10-CM | POA: Diagnosis not present

## 2023-02-25 MED ORDER — ACETAMINOPHEN 500 MG PO TABS
1000.0000 mg | ORAL_TABLET | Freq: Once | ORAL | Status: AC
  Administered 2023-02-25: 1000 mg via ORAL
  Filled 2023-02-25: qty 2

## 2023-02-25 NOTE — ED Provider Triage Note (Signed)
Emergency Medicine Provider Triage Evaluation Note  Amber Mathews , a 38 y.o. female  was evaluated in triage.  Pt complains of right shoulder pain that began minutes prior to arrival.  Patient was throwing a ball with her daughter when she got a pain in the front part of her shoulder and now cannot raise her arm above her shoulder.  Patient has full sensation distally in good grip strength distally and denies skin color changes.  Patient stable to move arm at the elbow as well.  Patient states that she feels she has limited range of motion in her shoulder and wants to make sure is not dislocated..  Review of Systems  Positive:  Negative:   Physical Exam  BP (!) 139/90 (BP Location: Left Arm)   Pulse 77   Temp 98.2 F (36.8 C) (Oral)   Resp 17   SpO2 100%  Gen:   Awake, no distress   Resp:  Normal effort  MSK:   5 out of 5 bilateral grip strength, mild tenderness to anterior portion of right shoulder without obvious deformity or step-off, no clavicular tenderness or bony step-off, 5 out of 5 hand to belly, 5 out of 5 external rotation/internal rotation, 5 out of 5 elbow flexion/extension, able to slightly raise arm to shoulder height however cannot go overhead due to pain Other:    Medical Decision Making  Medically screening exam initiated at 4:54 PM.  Appropriate orders placed.  LORENZA SHAKIR was informed that the remainder of the evaluation will be completed by another provider, this initial triage assessment does not replace that evaluation, and the importance of remaining in the ED until their evaluation is complete.  Workup initiated, will get imaging and order sling along with Tylenol as I dissipate patient is strain of her shoulder, patient stable at this time.   Netta Corrigan, PA-C 02/25/23 1656

## 2023-02-25 NOTE — ED Provider Notes (Signed)
[place] EMERGENCY DEPARTMENT AT [facility] Provider Note   CSN: [id number] Arrival date & time: 02/25/23  1643     History  Chief Complaint  Patient presents with   Shoulder Injury    Amber Mathews is a 38 y.o. female with no pertinent past medical history presented for right shoulder pain that began minutes prior to arrival.  Patient was throwing a ball with her daughter when she got a pain in the front part of her shoulder and now cannot raise her arm above her shoulder.  Patient has full sensation distally in good grip strength distally and denies skin color changes.  Patient stable to move arm at the elbow as well.  Patient states that she feels she has limited range of motion in her shoulder and wants to make sure is not dislocated..   Home Medications Prior to Admission medications   Medication Sig Start Date End Date Taking? Authorizing Provider  benzonatate (TESSALON) 100 MG capsule Take 1 capsule (100 mg total) by mouth 3 (three) times daily as needed for cough. 01/09/23   Tilden Fossa, MD  levETIRAcetam (KEPPRA) 500 MG tablet Take 1 tablet (500 mg total) by mouth 2 (two) times daily. 02/12/22   Windell Norfolk, MD  methocarbamol (ROBAXIN) 500 MG tablet Take 1 tablet (500 mg total) by mouth 2 (two) times daily. 10/04/22   Marita Kansas, PA-C      Allergies    Patient has no known allergies.    Review of Systems   Review of Systems  Physical Exam Updated Vital Signs BP (!) 139/90 (BP Location: Left Arm)   Pulse 77   Temp 98.2 F (36.8 C) (Oral)   Resp 17   LMP 02/15/2023 (Exact Date)   SpO2 100%  Physical Exam Vitals reviewed.  Constitutional:      General: She is not in acute distress. Cardiovascular:     Rate and Rhythm: Normal rate.     Pulses: Normal pulses.  Musculoskeletal:     Comments: Left Shoulder/Arm: 5 out of 5 bilateral grip strength, mild tenderness to anterior portion of right shoulder without obvious deformity or step-off, no  clavicular tenderness or bony step-off, 5 out of 5 hand to belly, 5 out of 5 external rotation/internal rotation, 5 out of 5 elbow flexion/extension, able to slightly raise arm to shoulder height however cannot go overhead due to pain Pain not out of proportion Soft compartments  Skin:    General: Skin is warm and dry.     Capillary Refill: Capillary refill takes less than 2 seconds.  Neurological:     Mental Status: She is alert.     Comments: Sensation intact distally  Psychiatric:        Mood and Affect: Mood normal.     ED Results / Procedures / Treatments   Labs (all labs ordered are listed, but only abnormal results are displayed) Labs Reviewed - No data to display  EKG None  Radiology DG Shoulder Right Result Date: 02/25/2023 CLINICAL DATA:  Right shoulder pain after throwing a ball EXAM: RIGHT SHOULDER - 2+ VIEW COMPARISON:  10/04/2022 FINDINGS: Internal rotation, external rotation, and transscapular views of the right shoulder are obtained. No fracture, subluxation, or dislocation. Joint spaces are well preserved. Soft tissues are unremarkable. Right chest is clear. IMPRESSION: 1. Unremarkable right shoulder. Electronically Signed   By: Sharlet Salina M.D.   On: 02/25/2023 17:17    Procedures Procedures    Medications Ordered in ED Medications  acetaminophen (TYLENOL) tablet 1,000 mg (1,000 mg Oral Given 02/25/23 1701)    ED Course/ Medical Decision Making/ A&P                                 Medical Decision Making Amount and/or Complexity of Data Reviewed Radiology: ordered.  Risk OTC drugs.   Amber Mathews 38 y.o. presented today for right shoulder pain. Working DDx that I considered at this time includes, but not limited to, contusion, strain/sprain, fracture, dislocation, neurovascular compromise, septic joint, ischemic limb, compartment syndrome.  R/o DDx: contusion, fracture, dislocation, neurovascular compromise, septic joint, ischemic limb,  compartment syndrome: These are considered less likely due to history of present illness, physical exam, labs/imaging findings.  Review of prior external notes: 01/29/2023 office visit  Unique Tests and My Independent Interpretation:  Right shoulder x-ray: No acute pathology  Social Determinants of Health: none  Discussion with Independent Historian: None  Discussion of Management of Tests: None  Risk: Medium: prescription drug management  Risk Stratification Score: None  Plan: On exam patient was in no acute distress stable vitals.  Patient ultimately has reassuring exam she is neuro vastly intact however is unable to raise her arm above her shoulder suspicious of possible shoulder strain.  Patient was most concerned about dislocation however given that she does not have any obvious deformities with a negative x-ray do not feel patient has shoulder dislocation.  Will give patient sling encouraged to use Tylenol every 6 hours needed for pain.  I offered lidocaine patches however patient declined.  Patient's physical exam and imaging are reassuring.  I spoke to the patient about RICE therapy including Tylenol every 6 hours needed pain, ice 3-4 times daily for 15 minutes at a time, elevation of extremity, using a brace and to follow-up with their primary care provider.  Patient was given return precautions. Patient stable for discharge at this time.  Patient verbalized understanding of plan.  This chart was dictated using voice recognition software.  Despite best efforts to proofread,  errors can occur which can change the documentation meaning.         Final Clinical Impression(s) / ED Diagnoses Final diagnoses:  Strain of right shoulder, initial encounter    Rx / DC Orders ED Discharge Orders     None         Remi Deter 02/25/23 1746    Melene Plan, DO 02/25/23 2014

## 2023-02-25 NOTE — Discharge Instructions (Signed)
Please follow-up with your primary care provider in regards to recent ER visit. Today's physical exam and imaging was reassuring and you most likely have a benign process going on. You may rest, ice, compress, elevate your extremity and use Tylenol every 6 hours as needed for pain. If symptoms persist you may reach out to the orthopedic specialist have attached you for you today. If you begin to have decreased sensation, skin color changes, pain out of proportion/not controlled by over-the-counter medications, rigid compartments, or other worsening of symptoms please return to ER.

## 2023-02-25 NOTE — ED Triage Notes (Signed)
Pt reports she was throwing putty and developed right shoulder pain. No obvious deformity.

## 2023-06-24 ENCOUNTER — Emergency Department (HOSPITAL_BASED_OUTPATIENT_CLINIC_OR_DEPARTMENT_OTHER)
Admission: EM | Admit: 2023-06-24 | Discharge: 2023-06-24 | Disposition: A | Discharge status: Home / Self Care | Source: Home / Self Care | Attending: Emergency Medicine | Admitting: Emergency Medicine

## 2023-06-24 ENCOUNTER — Encounter (HOSPITAL_COMMUNITY): Payer: Self-pay

## 2023-06-24 ENCOUNTER — Emergency Department (HOSPITAL_COMMUNITY)
Admission: EM | Admit: 2023-06-24 | Discharge: 2023-06-24 | Disposition: A | Discharge status: Home / Self Care | Attending: Emergency Medicine | Admitting: Emergency Medicine

## 2023-06-24 ENCOUNTER — Other Ambulatory Visit: Payer: Self-pay

## 2023-06-24 ENCOUNTER — Emergency Department (HOSPITAL_BASED_OUTPATIENT_CLINIC_OR_DEPARTMENT_OTHER)

## 2023-06-24 ENCOUNTER — Emergency Department (HOSPITAL_COMMUNITY)

## 2023-06-24 DIAGNOSIS — R1013 Epigastric pain: Secondary | ICD-10-CM | POA: Insufficient documentation

## 2023-06-24 DIAGNOSIS — T7421XA Adult sexual abuse, confirmed, initial encounter: Secondary | ICD-10-CM | POA: Diagnosis present

## 2023-06-24 DIAGNOSIS — R519 Headache, unspecified: Secondary | ICD-10-CM | POA: Insufficient documentation

## 2023-06-24 DIAGNOSIS — R0789 Other chest pain: Secondary | ICD-10-CM | POA: Diagnosis not present

## 2023-06-24 DIAGNOSIS — S0990XA Unspecified injury of head, initial encounter: Secondary | ICD-10-CM | POA: Insufficient documentation

## 2023-06-24 DIAGNOSIS — R6884 Jaw pain: Secondary | ICD-10-CM | POA: Insufficient documentation

## 2023-06-24 DIAGNOSIS — Z5321 Procedure and treatment not carried out due to patient leaving prior to being seen by health care provider: Secondary | ICD-10-CM | POA: Diagnosis not present

## 2023-06-24 LAB — HCG, QUANTITATIVE, PREGNANCY: hCG, Beta Chain, Quant, S: <1 m[IU]/mL (ref <5)

## 2023-06-24 LAB — COMPREHENSIVE METABOLIC PANEL WITH GFR
ALT: 21 U/L (ref 0–44)
AST: 36 U/L (ref 15–41)
Albumin: 4.3 g/dL (ref 3.5–5.0)
Alkaline Phosphatase: 116 U/L (ref 38–126)
Anion gap: 13 (ref 5–15)
BUN: 10 mg/dL (ref 6–20)
CO2: 25 mmol/L (ref 22–32)
Calcium: 10 mg/dL (ref 8.9–10.3)
Chloride: 103 mmol/L (ref 98–111)
Creatinine, Ser: 0.8 mg/dL (ref 0.44–1.00)
GFR, Estimated: >60 mL/min (ref >60)
Glucose, Bld: 89 mg/dL (ref 70–99)
Potassium: 3.9 mmol/L (ref 3.5–5.1)
Sodium: 140 mmol/L (ref 135–145)
Total Bilirubin: 0.7 mg/dL (ref 0.0–1.2)
Total Protein: 7.2 g/dL (ref 6.5–8.1)

## 2023-06-24 LAB — CBC WITH DIFFERENTIAL/PLATELET
Abs Immature Granulocytes: 0.01 10*3/uL (ref 0.00–0.07)
Basophils Absolute: 0 10*3/uL (ref 0.0–0.1)
Basophils Relative: 0 %
Eosinophils Absolute: 0 10*3/uL (ref 0.0–0.5)
Eosinophils Relative: 0 %
HCT: 39.9 % (ref 36.0–46.0)
Hemoglobin: 13.2 g/dL (ref 12.0–15.0)
Immature Granulocytes: 0 %
Lymphocytes Relative: 15 %
Lymphs Abs: 1.4 10*3/uL (ref 0.7–4.0)
MCH: 28.9 pg (ref 26.0–34.0)
MCHC: 33.1 g/dL (ref 30.0–36.0)
MCV: 87.5 fL (ref 80.0–100.0)
Monocytes Absolute: 0.7 10*3/uL (ref 0.1–1.0)
Monocytes Relative: 8 %
Neutro Abs: 7.2 10*3/uL (ref 1.7–7.7)
Neutrophils Relative %: 77 %
Platelets: 310 10*3/uL (ref 150–400)
RBC: 4.56 MIL/uL (ref 3.87–5.11)
RDW: 13 % (ref 11.5–15.5)
WBC: 9.4 10*3/uL (ref 4.0–10.5)
nRBC: 0 % (ref 0.0–0.2)

## 2023-06-24 LAB — LIPASE, BLOOD: Lipase: 18 U/L (ref 11–51)

## 2023-06-24 MED ORDER — IBUPROFEN 600 MG PO TABS
600.0000 mg | ORAL_TABLET | Freq: Four times a day (QID) | ORAL | 0 refills | Status: AC | PRN
Start: 2023-06-24 — End: ?

## 2023-06-24 MED ORDER — MORPHINE SULFATE (PF) 4 MG/ML IV SOLN
4.0000 mg | Freq: Once | INTRAVENOUS | Status: AC
  Administered 2023-06-24: 4 mg via INTRAVENOUS
  Filled 2023-06-24: qty 1

## 2023-06-24 MED ORDER — OXYCODONE HCL 5 MG PO TABS: 5.0000 mg | ORAL_TABLET | Freq: Four times a day (QID) | ORAL | 0 refills | Status: AC | PRN

## 2023-06-24 MED ORDER — CYCLOBENZAPRINE HCL 10 MG PO TABS: 10.0000 mg | ORAL_TABLET | Freq: Two times a day (BID) | ORAL | 0 refills | Status: AC | PRN

## 2023-06-24 MED ORDER — ONDANSETRON HCL 4 MG/2ML IJ SOLN
4.0000 mg | Freq: Once | INTRAMUSCULAR | Status: AC
  Administered 2023-06-24: 4 mg via INTRAVENOUS
  Filled 2023-06-24: qty 2

## 2023-06-24 MED ORDER — SODIUM CHLORIDE 0.9 % IV BOLUS
1000.0000 mL | Freq: Once | INTRAVENOUS | Status: AC
  Administered 2023-06-24: 1000 mL via INTRAVENOUS

## 2023-06-24 MED ORDER — IOHEXOL 300 MG/ML  SOLN
100.0000 mL | Freq: Once | INTRAMUSCULAR | Status: AC | PRN
  Administered 2023-06-24: 85 mL via INTRAVENOUS

## 2023-06-24 MED ORDER — ACETAMINOPHEN 325 MG PO TABS: 650.0000 mg | ORAL_TABLET | Freq: Four times a day (QID) | ORAL | 0 refills | Status: AC | PRN

## 2023-06-24 MED ORDER — KETOROLAC TROMETHAMINE 15 MG/ML IJ SOLN
15.0000 mg | Freq: Once | INTRAMUSCULAR | Status: AC
  Administered 2023-06-24: 15 mg via INTRAVENOUS
  Filled 2023-06-24: qty 1
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
## 2023-06-24 NOTE — ED Notes (Signed)
Pt stated that she was leaving  

## 2023-06-24 NOTE — ED Notes (Signed)
 Pt has ride home ,understands all d/c instructions
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
## 2023-08-26 ENCOUNTER — Other Ambulatory Visit: Payer: Self-pay

## 2023-08-26 ENCOUNTER — Emergency Department (HOSPITAL_BASED_OUTPATIENT_CLINIC_OR_DEPARTMENT_OTHER)
Admission: EM | Admit: 2023-08-26 | Discharge: 2023-08-27 | Disposition: A | Discharge status: Home / Self Care | Attending: Emergency Medicine | Admitting: Emergency Medicine

## 2023-08-26 ENCOUNTER — Encounter (HOSPITAL_BASED_OUTPATIENT_CLINIC_OR_DEPARTMENT_OTHER): Payer: Self-pay | Admitting: Emergency Medicine

## 2023-08-26 DIAGNOSIS — Z113 Encounter for screening for infections with a predominantly sexual mode of transmission: Secondary | ICD-10-CM | POA: Diagnosis not present

## 2023-08-26 DIAGNOSIS — R1031 Right lower quadrant pain: Secondary | ICD-10-CM | POA: Diagnosis present

## 2023-08-26 DIAGNOSIS — R1030 Lower abdominal pain, unspecified: Secondary | ICD-10-CM

## 2023-08-26 DIAGNOSIS — Z711 Person with feared health complaint in whom no diagnosis is made: Secondary | ICD-10-CM

## 2023-08-26 DIAGNOSIS — R197 Diarrhea, unspecified: Secondary | ICD-10-CM | POA: Insufficient documentation

## 2023-08-26 LAB — CBC
HCT: 37.1 % (ref 36.0–46.0)
Hemoglobin: 12.4 g/dL (ref 12.0–15.0)
MCH: 29.4 pg (ref 26.0–34.0)
MCHC: 33.4 g/dL (ref 30.0–36.0)
MCV: 87.9 fL (ref 80.0–100.0)
Platelets: 289 K/uL (ref 150–400)
RBC: 4.22 MIL/uL (ref 3.87–5.11)
RDW: 13 % (ref 11.5–15.5)
WBC: 6.4 K/uL (ref 4.0–10.5)
nRBC: 0 % (ref 0.0–0.2)

## 2023-08-26 LAB — URINALYSIS, ROUTINE W REFLEX MICROSCOPIC
Bilirubin Urine: NEGATIVE
Glucose, UA: NEGATIVE mg/dL
Hgb urine dipstick: NEGATIVE
Ketones, ur: NEGATIVE mg/dL
Leukocytes,Ua: NEGATIVE
Nitrite: NEGATIVE
Protein, ur: NEGATIVE mg/dL
Specific Gravity, Urine: 1.02 (ref 1.005–1.030)
pH: 5.5 (ref 5.0–8.0)

## 2023-08-26 LAB — PREGNANCY, URINE: Preg Test, Ur: NEGATIVE

## 2023-08-26 NOTE — ED Triage Notes (Signed)
 LRQ pain. Constant x2 days. Diarrhea the first day-has resolved. Denies body aches, chills, N/V. No abd surg hx, denies medical hx, no daily meds.

## 2023-08-27 LAB — WET PREP, GENITAL
Sperm: NONE SEEN
Trich, Wet Prep: NONE SEEN
WBC, Wet Prep HPF POC: <10 (ref <10)
Yeast Wet Prep HPF POC: NONE SEEN

## 2023-08-27 LAB — COMPREHENSIVE METABOLIC PANEL WITH GFR
ALT: 10 U/L (ref 0–44)
AST: 19 U/L (ref 15–41)
Albumin: 4.2 g/dL (ref 3.5–5.0)
Alkaline Phosphatase: 108 U/L (ref 38–126)
Anion gap: 10 (ref 5–15)
BUN: 11 mg/dL (ref 6–20)
CO2: 23 mmol/L (ref 22–32)
Calcium: 9.2 mg/dL (ref 8.9–10.3)
Chloride: 107 mmol/L (ref 98–111)
Creatinine, Ser: 0.94 mg/dL (ref 0.44–1.00)
GFR, Estimated: >60 mL/min (ref >60)
Glucose, Bld: 94 mg/dL (ref 70–99)
Potassium: 4 mmol/L (ref 3.5–5.1)
Sodium: 140 mmol/L (ref 135–145)
Total Bilirubin: 0.4 mg/dL (ref 0.0–1.2)
Total Protein: 6.6 g/dL (ref 6.5–8.1)

## 2023-08-27 LAB — LIPASE, BLOOD: Lipase: 29 U/L (ref 11–51)

## 2023-08-27 MED ORDER — CEFTRIAXONE SODIUM 500 MG IJ SOLR
500.0000 mg | Freq: Once | INTRAMUSCULAR | Status: AC
  Administered 2023-08-27: 500 mg via INTRAMUSCULAR
  Filled 2023-08-27: qty 500

## 2023-08-27 MED ORDER — DOXYCYCLINE HYCLATE 100 MG PO CAPS: 100.0000 mg | ORAL_CAPSULE | Freq: Two times a day (BID) | ORAL | 0 refills | Status: AC

## 2023-08-27 MED ORDER — LIDOCAINE HCL (PF) 1 % IJ SOLN
INTRAMUSCULAR | Status: AC
  Filled 2023-08-27: qty 5

## 2023-08-27 MED ORDER — DOXYCYCLINE HYCLATE 100 MG PO TABS
100.0000 mg | ORAL_TABLET | Freq: Once | ORAL | Status: AC
  Administered 2023-08-27: 100 mg via ORAL
  Filled 2023-08-27: qty 1

## 2023-08-27 MED ORDER — NAPROXEN 500 MG PO TABS: 500.0000 mg | ORAL_TABLET | Freq: Two times a day (BID) | ORAL | 0 refills | Status: AC

## 2023-08-27 NOTE — Discharge Instructions (Signed)
 You were seen today for abdominal pain.  Your workup is reassuring.  You were tested and treated for STDs.  Follow-up closely with your primary doctor if symptoms persist.  Abstain from sexual activity for the next 10 days.

## 2023-08-27 NOTE — ED Provider Notes (Signed)
 [place] EMERGENCY DEPARTMENT AT [facility] Provider Note   CSN: [id number] Arrival date & time: 08/26/23  2312     Patient presents with: Abdominal Pain   Amber Mathews is a 38 y.o. female.   HPI     This is a 38 year old female who presents with abdominal pain.  Patient reports lower abdominal pain over the last 24 to 48 hours.  She does report that she had some diarrhea initially but no nausea, vomiting, fevers.  Denies dysuria.  Does report that she is concerned about STDs but has not had any vaginal discharge or new sexual partners.  Pain does not lateralize.  She did not take anything for the pain.  Prior to Admission medications   Medication Sig Start Date End Date Taking? Authorizing Provider  doxycycline  (VIBRAMYCIN ) 100 MG capsule Take 1 capsule (100 mg total) by mouth 2 (two) times daily. 08/27/23  Yes Reice Bienvenue, Charmaine FALCON, MD  naproxen  (NAPROSYN ) 500 MG tablet Take 1 tablet (500 mg total) by mouth 2 (two) times daily. 08/27/23  Yes Iliana Hutt, Charmaine FALCON, MD  acetaminophen  (TYLENOL ) 325 MG tablet Take 2 tablets (650 mg total) by mouth every 6 (six) hours as needed. 06/24/23   Elnor Jayson LABOR, DO  cyclobenzaprine  (FLEXERIL ) 10 MG tablet Take 1 tablet (10 mg total) by mouth 2 (two) times daily as needed for muscle spasms. 06/24/23   Elnor Jayson LABOR, DO  ibuprofen  (ADVIL ) 600 MG tablet Take 1 tablet (600 mg total) by mouth every 6 (six) hours as needed. 06/24/23   Elnor Jayson LABOR, DO  levETIRAcetam  (KEPPRA ) 500 MG tablet Take 1 tablet (500 mg total) by mouth 2 (two) times daily. 02/12/22   Gregg Lek, MD  oxyCODONE  (ROXICODONE ) 5 MG immediate release tablet Take 1 tablet (5 mg total) by mouth every 6 (six) hours as needed. 06/24/23   Elnor Jayson LABOR, DO    Allergies: Patient has no known allergies.    Review of Systems  Constitutional:  Negative for fever.  Respiratory:  Negative for shortness of breath.   Cardiovascular:  Negative for chest pain.  Gastrointestinal:   Positive for abdominal pain and diarrhea. Negative for nausea and vomiting.  All other systems reviewed and are negative.   Updated Vital Signs BP 117/85   Pulse 81   Temp 98.3 F (36.8 C) (Oral)   Resp 16   LMP 07/29/2023 (Approximate)   SpO2 100%   Physical Exam Vitals and nursing note reviewed.  Constitutional:      Appearance: She is well-developed.  HENT:     Head: Normocephalic and atraumatic.  Eyes:     Pupils: Pupils are equal, round, and reactive to light.  Cardiovascular:     Rate and Rhythm: Normal rate and regular rhythm.     Heart sounds: Normal heart sounds.  Pulmonary:     Effort: Pulmonary effort is normal. No respiratory distress.     Breath sounds: No wheezing.  Abdominal:     General: Bowel sounds are normal.     Palpations: Abdomen is soft.     Tenderness: There is abdominal tenderness in the suprapubic area. There is no guarding or rebound.  Genitourinary:    Comments: External vaginal exam reassuring, no cervical motion tenderness, no significant vaginal discharge Musculoskeletal:     Cervical back: Neck supple.  Skin:    General: Skin is warm and dry.  Neurological:     Mental Status: She is alert and oriented to person, place, and time.     (  all labs ordered are listed, but only abnormal results are displayed) Labs Reviewed  WET PREP, GENITAL - Abnormal; Notable for the following components:      Result Value   Clue Cells Wet Prep HPF POC PRESENT (*)    All other components within normal limits  LIPASE, BLOOD  COMPREHENSIVE METABOLIC PANEL WITH GFR  CBC  URINALYSIS, ROUTINE W REFLEX MICROSCOPIC  PREGNANCY, URINE  GC/CHLAMYDIA PROBE AMP ([place]) NOT AT [facility]    EKG: None  Radiology: No results found.   Procedures   Medications Ordered in the ED  lidocaine  (PF) (XYLOCAINE ) 1 % injection (has no administration in time range)  cefTRIAXone  (ROCEPHIN ) injection 500 mg (500 mg Intramuscular Given 08/27/23 0212)  doxycycline   (VIBRA -TABS) tablet 100 mg (100 mg Oral Given 08/27/23 0212)                                    Medical Decision Making Amount and/or Complexity of Data Reviewed Labs: ordered.  Risk Prescription drug management.   This patient presents to the ED for concern of abdominal pain, this involves an extensive number of treatment options, and is a complaint that carries with it a high risk of complications and morbidity.  I considered the following differential and admission for this acute, potentially life threatening condition.  The differential diagnosis includes cystitis, STD, pathology, appendicitis, gastritis, gastroenteritis, colitis, diverticulitis  MDM:    This is a 38 year old female who presents with lower abdominal pain.  She is nontoxic and vital signs are reassuring.  She has some mild tenderness to palpation in the suprapubic region.  Labs obtained and reviewed.  Reassuring.  No evidence of UTI.  No leukocytosis.  Patient was tested and treated for STDs.  Wet prep with clue cells but no other finding.  Patient was given doxycycline  and Rocephin .  Will treat with dose doxycycline  as an outpatient.  Given rather benign abdominal exam, will defer imaging at this time.  Low suspicion for ovarian pathology or torsion.  (Labs, imaging, consults)  Labs: I Ordered, and personally interpreted labs.  The pertinent results include: CBC, CMP, lipase, GC, urinalysis  Imaging Studies ordered: I ordered imaging studies including none I independently visualized and interpreted imaging. I agree with the radiologist interpretation  Additional history obtained from chart review.  External records from outside source obtained and reviewed including prior evaluations  Cardiac Monitoring: The patient was maintained on a cardiac monitor.  If on the cardiac monitor, I personally viewed and interpreted the cardiac monitored which showed an underlying rhythm of: Sinus  Reevaluation: After the  interventions noted above, I reevaluated the patient and found that they have :stayed the same  Social Determinants of Health:  lives independently  Disposition: Discharge  Co morbidities that complicate the patient evaluation  Past Medical History:  Diagnosis Date   Ectopic pregnancy    Had MTX   Herpes genitalia    last outbreak 2013     Medicines Meds ordered this encounter  Medications   cefTRIAXone  (ROCEPHIN ) injection 500 mg    Antibiotic Indication::   STD   doxycycline  (VIBRA -TABS) tablet 100 mg   lidocaine  (PF) (XYLOCAINE ) 1 % injection    Viktoria, Brandi R: cabinet override   naproxen  (NAPROSYN ) 500 MG tablet    Sig: Take 1 tablet (500 mg total) by mouth 2 (two) times daily.    Dispense:  30 tablet  Refill:  0   doxycycline  (VIBRAMYCIN ) 100 MG capsule    Sig: Take 1 capsule (100 mg total) by mouth 2 (two) times daily.    Dispense:  20 capsule    Refill:  0    I have reviewed the patients home medicines and have made adjustments as needed  Problem List / ED Course: Problem List Items Addressed This Visit   None Visit Diagnoses       Concern about STD in female without diagnosis    -  Primary     Lower abdominal pain                    Final diagnoses:  Concern about STD in female without diagnosis  Lower abdominal pain    ED Discharge Orders          Ordered    naproxen  (NAPROSYN ) 500 MG tablet  2 times daily        08/27/23 0308    doxycycline  (VIBRAMYCIN ) 100 MG capsule  2 times daily        08/27/23 0308               Kenneth Lax, Charmaine FALCON, MD 08/27/23 [phone]

## 2023-08-30 LAB — GC/CHLAMYDIA PROBE AMP (~~LOC~~) NOT AT ARMC
Chlamydia: NEGATIVE
Comment: NEGATIVE
Comment: NORMAL
Neisseria Gonorrhea: NEGATIVE
